# Patient Record
Sex: Female | Born: 1965 | Race: White | Hispanic: No | Marital: Married | Smoking: Former smoker
Health system: Southern US, Community
[De-identification: ages and names within clinical notes are randomized; demographics above are authoritative.]

## PROBLEM LIST (undated history)

## (undated) DIAGNOSIS — K219 Gastro-esophageal reflux disease without esophagitis: Secondary | ICD-10-CM

## (undated) DIAGNOSIS — K529 Noninfective gastroenteritis and colitis, unspecified: Secondary | ICD-10-CM

## (undated) DIAGNOSIS — J302 Other seasonal allergic rhinitis: Secondary | ICD-10-CM

## (undated) DIAGNOSIS — E782 Mixed hyperlipidemia: Secondary | ICD-10-CM

## (undated) HISTORY — PX: ABDOMINAL HYSTERECTOMY: SHX81

---

## 2013-06-06 ENCOUNTER — Emergency Department (HOSPITAL_COMMUNITY): Payer: BC Managed Care – PPO

## 2013-06-06 ENCOUNTER — Encounter (HOSPITAL_COMMUNITY): Payer: Self-pay | Admitting: Emergency Medicine

## 2013-06-06 ENCOUNTER — Observation Stay (HOSPITAL_COMMUNITY): Payer: BC Managed Care – PPO

## 2013-06-06 ENCOUNTER — Observation Stay (HOSPITAL_COMMUNITY)
Admission: EM | Admit: 2013-06-06 | Discharge: 2013-06-07 | Disposition: A | Discharge status: Home / Self Care | Payer: BC Managed Care – PPO | Attending: Internal Medicine | Admitting: Internal Medicine

## 2013-06-06 DIAGNOSIS — Z720 Tobacco use: Secondary | ICD-10-CM

## 2013-06-06 DIAGNOSIS — E785 Hyperlipidemia, unspecified: Secondary | ICD-10-CM | POA: Insufficient documentation

## 2013-06-06 DIAGNOSIS — F17200 Nicotine dependence, unspecified, uncomplicated: Secondary | ICD-10-CM | POA: Insufficient documentation

## 2013-06-06 DIAGNOSIS — J329 Chronic sinusitis, unspecified: Secondary | ICD-10-CM

## 2013-06-06 DIAGNOSIS — Z8249 Family history of ischemic heart disease and other diseases of the circulatory system: Secondary | ICD-10-CM | POA: Insufficient documentation

## 2013-06-06 DIAGNOSIS — E7800 Pure hypercholesterolemia, unspecified: Secondary | ICD-10-CM | POA: Insufficient documentation

## 2013-06-06 DIAGNOSIS — R55 Syncope and collapse: Secondary | ICD-10-CM

## 2013-06-06 DIAGNOSIS — J069 Acute upper respiratory infection, unspecified: Secondary | ICD-10-CM | POA: Insufficient documentation

## 2013-06-06 DIAGNOSIS — E876 Hypokalemia: Secondary | ICD-10-CM | POA: Insufficient documentation

## 2013-06-06 DIAGNOSIS — R0602 Shortness of breath: Secondary | ICD-10-CM | POA: Insufficient documentation

## 2013-06-06 DIAGNOSIS — R209 Unspecified disturbances of skin sensation: Secondary | ICD-10-CM | POA: Insufficient documentation

## 2013-06-06 DIAGNOSIS — J019 Acute sinusitis, unspecified: Secondary | ICD-10-CM | POA: Insufficient documentation

## 2013-06-06 DIAGNOSIS — R079 Chest pain, unspecified: Principal | ICD-10-CM | POA: Insufficient documentation

## 2013-06-06 HISTORY — DX: Mixed hyperlipidemia: E78.2

## 2013-06-06 LAB — CBC WITH DIFFERENTIAL/PLATELET
BASOS ABS: 0 10*3/uL (ref 0.0–0.1)
Basophils Relative: 1 % (ref 0–1)
Eosinophils Absolute: 0.1 10*3/uL (ref 0.0–0.7)
Eosinophils Relative: 2 % (ref 0–5)
HEMATOCRIT: 37.7 % (ref 36.0–46.0)
HEMOGLOBIN: 13.3 g/dL (ref 12.0–15.0)
LYMPHS ABS: 1 10*3/uL (ref 0.7–4.0)
LYMPHS PCT: 14 % (ref 12–46)
MCH: 33.5 pg (ref 26.0–34.0)
MCHC: 35.3 g/dL (ref 30.0–36.0)
MCV: 95 fL (ref 78.0–100.0)
MONO ABS: 0.4 10*3/uL (ref 0.1–1.0)
Monocytes Relative: 5 % (ref 3–12)
NEUTROS ABS: 5.6 10*3/uL (ref 1.7–7.7)
Neutrophils Relative %: 78 % — ABNORMAL HIGH (ref 43–77)
Platelets: 193 10*3/uL (ref 150–400)
RBC: 3.97 MIL/uL (ref 3.87–5.11)
RDW: 13.6 % (ref 11.5–15.5)
WBC: 7.1 10*3/uL (ref 4.0–10.5)

## 2013-06-06 LAB — URINALYSIS, ROUTINE W REFLEX MICROSCOPIC
Bilirubin Urine: NEGATIVE
Glucose, UA: NEGATIVE mg/dL
Ketones, ur: NEGATIVE mg/dL
Leukocytes, UA: NEGATIVE
NITRITE: NEGATIVE
Protein, ur: NEGATIVE mg/dL
Urobilinogen, UA: 0.2 mg/dL (ref 0.0–1.0)
pH: 6 (ref 5.0–8.0)

## 2013-06-06 LAB — BASIC METABOLIC PANEL
BUN: 8 mg/dL (ref 6–23)
CHLORIDE: 101 meq/L (ref 96–112)
CO2: 27 meq/L (ref 19–32)
CREATININE: 0.69 mg/dL (ref 0.50–1.10)
Calcium: 9.2 mg/dL (ref 8.4–10.5)
GFR calc Af Amer: >90 mL/min (ref >90)
GFR calc non Af Amer: >90 mL/min (ref >90)
Glucose, Bld: 101 mg/dL — ABNORMAL HIGH (ref 70–99)
Potassium: 3.6 mEq/L — ABNORMAL LOW (ref 3.7–5.3)
SODIUM: 141 meq/L (ref 137–147)

## 2013-06-06 LAB — TROPONIN I: Troponin I: <0.3 ng/mL (ref <0.30)

## 2013-06-06 LAB — URINE MICROSCOPIC-ADD ON

## 2013-06-06 MED ORDER — HYDROCODONE-ACETAMINOPHEN 5-325 MG PO TABS
1.0000 | ORAL_TABLET | ORAL | Status: DC | PRN
  Filled 2013-06-06: qty 2

## 2013-06-06 MED ORDER — NITROGLYCERIN 0.4 MG SL SUBL: 0.4000 mg | SUBLINGUAL_TABLET | SUBLINGUAL | Status: DC | PRN

## 2013-06-06 MED ORDER — ACETAMINOPHEN 325 MG PO TABS
650.0000 mg | ORAL_TABLET | Freq: Four times a day (QID) | ORAL | Status: DC | PRN
  Administered 2013-06-07: 650 mg via ORAL
  Filled 2013-06-06: qty 2

## 2013-06-06 MED ORDER — SODIUM CHLORIDE 0.9 % IJ SOLN: 3.0000 mL | INTRAMUSCULAR | Status: DC | PRN

## 2013-06-06 MED ORDER — SODIUM CHLORIDE 0.9 % IV BOLUS (SEPSIS)
1000.0000 mL | Freq: Once | INTRAVENOUS | Status: AC
  Administered 2013-06-06: 1000 mL via INTRAVENOUS

## 2013-06-06 MED ORDER — GUAIFENESIN-DM 100-10 MG/5ML PO SYRP
5.0000 mL | ORAL_SOLUTION | ORAL | Status: DC | PRN
  Administered 2013-06-06: 5 mL via ORAL
  Filled 2013-06-06: qty 5

## 2013-06-06 MED ORDER — HEPARIN SODIUM (PORCINE) 5000 UNIT/ML IJ SOLN
5000.0000 [IU] | Freq: Three times a day (TID) | INTRAMUSCULAR | Status: DC
  Administered 2013-06-06 – 2013-06-07 (×3): 5000 [IU] via SUBCUTANEOUS
  Filled 2013-06-06 (×3): qty 1

## 2013-06-06 MED ORDER — ACETAMINOPHEN 650 MG RE SUPP: 650.0000 mg | Freq: Four times a day (QID) | RECTAL | Status: DC | PRN

## 2013-06-06 MED ORDER — ALBUTEROL SULFATE (2.5 MG/3ML) 0.083% IN NEBU
2.5000 mg | INHALATION_SOLUTION | Freq: Two times a day (BID) | RESPIRATORY_TRACT | Status: DC
  Administered 2013-06-07: 2.5 mg via RESPIRATORY_TRACT
  Filled 2013-06-06: qty 3

## 2013-06-06 MED ORDER — SODIUM CHLORIDE 0.9 % IJ SOLN
3.0000 mL | Freq: Two times a day (BID) | INTRAMUSCULAR | Status: DC
  Administered 2013-06-06 – 2013-06-07 (×2): 3 mL via INTRAVENOUS

## 2013-06-06 MED ORDER — ALUM & MAG HYDROXIDE-SIMETH 200-200-20 MG/5ML PO SUSP: 30.0000 mL | Freq: Four times a day (QID) | ORAL | Status: DC | PRN

## 2013-06-06 MED ORDER — ALBUTEROL SULFATE (2.5 MG/3ML) 0.083% IN NEBU
2.5000 mg | INHALATION_SOLUTION | Freq: Three times a day (TID) | RESPIRATORY_TRACT | Status: DC
  Administered 2013-06-06: 2.5 mg via RESPIRATORY_TRACT

## 2013-06-06 MED ORDER — MORPHINE SULFATE 2 MG/ML IJ SOLN: 1.0000 mg | INTRAMUSCULAR | Status: DC | PRN

## 2013-06-06 MED ORDER — NICOTINE 21 MG/24HR TD PT24
21.0000 mg | MEDICATED_PATCH | Freq: Every day | TRANSDERMAL | Status: DC
  Administered 2013-06-07: 21 mg via TRANSDERMAL
  Filled 2013-06-06: qty 1

## 2013-06-06 MED ORDER — OXYMETAZOLINE HCL 0.05 % NA SOLN
1.0000 | Freq: Two times a day (BID) | NASAL | Status: DC
  Administered 2013-06-06 – 2013-06-07 (×2): 1 via NASAL
  Filled 2013-06-06: qty 15

## 2013-06-06 MED ORDER — AZITHROMYCIN 250 MG PO TABS
500.0000 mg | ORAL_TABLET | Freq: Every day | ORAL | Status: DC
  Administered 2013-06-06 – 2013-06-07 (×2): 500 mg via ORAL
  Filled 2013-06-06 (×2): qty 2

## 2013-06-06 MED ORDER — ALBUTEROL SULFATE (2.5 MG/3ML) 0.083% IN NEBU
2.5000 mg | INHALATION_SOLUTION | RESPIRATORY_TRACT | Status: DC | PRN
  Filled 2013-06-06: qty 3

## 2013-06-06 MED ORDER — ASPIRIN 325 MG PO TABS
325.0000 mg | ORAL_TABLET | Freq: Once | ORAL | Status: AC
  Administered 2013-06-06: 325 mg via ORAL
  Filled 2013-06-06: qty 1

## 2013-06-06 MED ORDER — NICOTINE 21 MG/24HR TD PT24
21.0000 mg | MEDICATED_PATCH | Freq: Once | TRANSDERMAL | Status: AC
  Administered 2013-06-06: 21 mg via TRANSDERMAL
  Filled 2013-06-06: qty 1

## 2013-06-06 MED ORDER — ASPIRIN EC 325 MG PO TBEC
325.0000 mg | DELAYED_RELEASE_TABLET | Freq: Every day | ORAL | Status: DC
  Administered 2013-06-06 – 2013-06-07 (×2): 325 mg via ORAL
  Filled 2013-06-06 (×2): qty 1

## 2013-06-06 MED ORDER — ONDANSETRON HCL 4 MG/2ML IJ SOLN: 4.0000 mg | Freq: Four times a day (QID) | INTRAMUSCULAR | Status: DC | PRN

## 2013-06-06 MED ORDER — FLUTICASONE PROPIONATE 50 MCG/ACT NA SUSP
2.0000 | Freq: Every day | NASAL | Status: DC
  Administered 2013-06-06 – 2013-06-07 (×2): 2 via NASAL
  Filled 2013-06-06: qty 16

## 2013-06-06 MED ORDER — ONDANSETRON HCL 4 MG PO TABS: 4.0000 mg | ORAL_TABLET | Freq: Four times a day (QID) | ORAL | Status: DC | PRN

## 2013-06-06 MED ORDER — ONDANSETRON HCL 4 MG/2ML IJ SOLN
4.0000 mg | Freq: Once | INTRAMUSCULAR | Status: AC
  Administered 2013-06-06: 4 mg via INTRAVENOUS
  Filled 2013-06-06: qty 2

## 2013-06-06 MED ORDER — PANTOPRAZOLE SODIUM 40 MG PO TBEC
40.0000 mg | DELAYED_RELEASE_TABLET | Freq: Every day | ORAL | Status: DC
  Administered 2013-06-06 – 2013-06-07 (×2): 40 mg via ORAL
  Filled 2013-06-06 (×2): qty 1

## 2013-06-06 MED ORDER — LORATADINE 10 MG PO TABS
10.0000 mg | ORAL_TABLET | Freq: Every day | ORAL | Status: DC
  Administered 2013-06-06 – 2013-06-07 (×2): 10 mg via ORAL
  Filled 2013-06-06 (×3): qty 1

## 2013-06-06 MED ORDER — SODIUM CHLORIDE 0.9 % IV SOLN: 250.0000 mL | INTRAVENOUS | Status: DC | PRN

## 2013-06-06 MED ORDER — SODIUM CHLORIDE 0.9 % IJ SOLN
3.0000 mL | Freq: Two times a day (BID) | INTRAMUSCULAR | Status: DC
  Administered 2013-06-06: 3 mL via INTRAVENOUS

## 2013-06-06 NOTE — ED Provider Notes (Signed)
CSN: [id number]     Arrival date & time 06/06/13  1304 History  This chart was scribed for Christina Christen, MD by Zettie Pho, ED Scribe. This patient was seen in room APA02/APA02 and the patient's care was started at 2:03 PM.    Chief Complaint  Patient presents with  . Chest Pain  . Shortness of Breath   The history is provided by the patient. No language interpreter was used.   HPI Comments: Christina Richardson is a 48 y.o. Female brought in by EMS who presents to the Emergency Department complaining of an episode of pressure-like pain to the substernal region of the chest that radiated to the mid-back with sudden onset earlier today while she was at work. She states that this type of pain is new for her. Patient reports associated shortness of breath, diaphoresis, pallor, nausea, lightheadedness, and paresthesias to the bilateral hands and feet during the episode. She reports that all of these symptoms have been gradually improving. Patient states that she did not eat breakfast before work this morning, which is typical for her. Patient reports some associated congestion with a productive cough with yellow-colored sputum for the past few days. She reports taking Sudafed, OTC medications, and allergy medications at home without significant relief. She reports a familial history of cardiac problems (mother in her 42s, but not father or siblings). Patient is a current every day smoker, about 0.5 PPD. Patient has no other pertinent medical history.   History reviewed. No pertinent past medical history. Past Surgical History  Procedure Laterality Date  . Abdominal hysterectomy     History reviewed. No pertinent family history. History  Substance Use Topics  . Smoking status: Current Every Day Smoker  . Smokeless tobacco: Not on file  . Alcohol Use: Not on file   OB History   Grav Para Term Preterm Abortions TAB SAB Ect Mult Living                 Review of Systems  A complete 10 system review of  systems was obtained and all systems are negative except as noted in the HPI and PMH.    Allergies  Review of patient's allergies indicates no known allergies.  Home Medications   Prior to Admission medications   Medication Sig Start Date End Date Taking? Authorizing Provider  Dextromethorphan HBr (ROBITUSSIN COUGHGELS PO) Take 2 capsules by mouth as needed (cough).   Yes Historical Provider, MD  Phenylephrine-DM-GG-APAP (MUCINEX FAST-MAX PO) Take 2 tablets by mouth as needed (cold/cough).   Yes Historical Provider, MD   Triage Vitals: BP 118/77  Pulse 80  Temp(Src) 97.7 F (36.5 C) (Oral)  Resp 18  Ht 5\' 8"  (1.727 m)  Wt 145 lb (65.772 kg)  BMI 22.05 kg/m2  SpO2 100%  Physical Exam  Nursing note and vitals reviewed. Constitutional: She is oriented to person, place, and time. She appears well-developed and well-nourished.  HENT:  Head: Normocephalic and atraumatic.  Lips are dry.   Eyes: Conjunctivae and EOM are normal. Pupils are equal, round, and reactive to light.  Neck: Normal range of motion. Neck supple.  Cardiovascular: Normal rate, regular rhythm and normal heart sounds.   Pulmonary/Chest: Effort normal and breath sounds normal.  Abdominal: Soft. Bowel sounds are normal.  Musculoskeletal: Normal range of motion.  Neurological: She is alert and oriented to person, place, and time.  Skin: Skin is warm and dry.  Psychiatric: She has a normal mood and affect. Her behavior is normal.  ED Course  Procedures (including critical care time)  DIAGNOSTIC STUDIES: Oxygen Saturation is 100% on room air, normal by my interpretation.    COORDINATION OF CARE: 1:23 PM- Ordered EKG.   2:11 PM- Discussed cardiac risk factors. Advised of smoking cessation. Will order chest x-ray, troponin, CBC, BMP. Will order IV fluids, Zofran, and aspirin to manage symptoms. Discussed treatment plan with patient at bedside and patient verbalized agreement.   Results for orders placed during  the hospital encounter of 06/06/13  CBC WITH DIFFERENTIAL      Result Value Ref Range   WBC 7.1  4.0 - 10.5 K/uL   RBC 3.97  3.87 - 5.11 MIL/uL   Hemoglobin 13.3  12.0 - 15.0 g/dL   HCT 37.7  36.0 - 46.0 %   MCV 95.0  78.0 - 100.0 fL   MCH 33.5  26.0 - 34.0 pg   MCHC 35.3  30.0 - 36.0 g/dL   RDW 13.6  11.5 - 15.5 %   Platelets 193  150 - 400 K/uL   Neutrophils Relative % 78 (*) 43 - 77 %   Neutro Abs 5.6  1.7 - 7.7 K/uL   Lymphocytes Relative 14  12 - 46 %   Lymphs Abs 1.0  0.7 - 4.0 K/uL   Monocytes Relative 5  3 - 12 %   Monocytes Absolute 0.4  0.1 - 1.0 K/uL   Eosinophils Relative 2  0 - 5 %   Eosinophils Absolute 0.1  0.0 - 0.7 K/uL   Basophils Relative 1  0 - 1 %   Basophils Absolute 0.0  0.0 - 0.1 K/uL  BASIC METABOLIC PANEL      Result Value Ref Range   Sodium 141  137 - 147 mEq/L   Potassium 3.6 (*) 3.7 - 5.3 mEq/L   Chloride 101  96 - 112 mEq/L   CO2 27  19 - 32 mEq/L   Glucose, Bld 101 (*) 70 - 99 mg/dL   BUN 8  6 - 23 mg/dL   Creatinine, Ser 0.69  0.50 - 1.10 mg/dL   Calcium 9.2  8.4 - 10.5 mg/dL   GFR calc non Af Amer >90  >90 mL/min   GFR calc Af Amer >90  >90 mL/min  TROPONIN I      Result Value Ref Range   Troponin I <0.30  <0.30 ng/mL  URINALYSIS, ROUTINE W REFLEX MICROSCOPIC      Result Value Ref Range   Color, Urine YELLOW  YELLOW   APPearance CLEAR  CLEAR   Specific Gravity, Urine <1.005 (*) 1.005 - 1.030   pH 6.0  5.0 - 8.0   Glucose, UA NEGATIVE  NEGATIVE mg/dL   Hgb urine dipstick SMALL (*) NEGATIVE   Bilirubin Urine NEGATIVE  NEGATIVE   Ketones, ur NEGATIVE  NEGATIVE mg/dL   Protein, ur NEGATIVE  NEGATIVE mg/dL   Urobilinogen, UA 0.2  0.0 - 1.0 mg/dL   Nitrite NEGATIVE  NEGATIVE   Leukocytes, UA NEGATIVE  NEGATIVE  URINE MICROSCOPIC-ADD ON      Result Value Ref Range   Squamous Epithelial / LPF FEW (*) RARE   WBC, UA 0-2  <3 WBC/hpf   RBC / HPF 0-2  <3 RBC/hpf   Bacteria, UA RARE  RARE   Dg Chest 2 View  06/06/2013   CLINICAL DATA:   Chest pain, shortness of breath  EXAM: CHEST  2 VIEW  COMPARISON:  None.  FINDINGS: Cardiomediastinal silhouette is unremarkable. No acute infiltrate  or pleural effusion. No pulmonary edema. Bony thorax is unremarkable.  IMPRESSION: No active cardiopulmonary disease.   Electronically Signed   By: Lahoma Crocker M.D.   On: 06/06/2013 14:57    EKG Interpretation   Date/Time:  Thursday Jun 06 2013 13:23:59 EDT Ventricular Rate:  85 PR Interval:  168 QRS Duration: 99 QT Interval:  414 QTC Calculation: 492 R Axis:   54 Text Interpretation:  Sinus rhythm RSR' in V1 or V2, right VCD or RVH  Borderline prolonged QT interval Confirmed by Lacinda Axon  MD, Jenevie Casstevens ([id number]) on  06/06/2013 2:30:50 PM Also confirmed by Lacinda Axon  MD, Coalton Arch ([id number])  on  06/06/2013 3:22:24 PM      MDM   Final diagnoses:  Chest pain    Significant chest pressure with associated diaphoresis and sense of presyncope at work today.  Patient is a smoker and her mother had coronary artery disease in her 101s.   She feels better. Admit to observation.  Discussed with cardiologist and hospitalist.  I personally performed the services described in this documentation, which was scribed in my presence. The recorded information has been reviewed and is accurate.     Christina Christen, MD 06/06/13 [phone]

## 2013-06-06 NOTE — ED Notes (Signed)
Pt currently denies chest pain. nad.

## 2013-06-06 NOTE — ED Notes (Signed)
Pt waiting for inpt bed. No bed available at present. Pt and family aware
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
## 2013-06-06 NOTE — ED Notes (Signed)
Pt comes from work via EMS with c/o centralized chest pain and SOB. Pt states symptoms began shortly after arriving to work. Pt describes pain as pressure. Pt also reports nausea, "tingling" in face and arms, lightheadedness. Pt states for past week she's had productive cough with yellow sputum.

## 2013-06-07 ENCOUNTER — Observation Stay (HOSPITAL_COMMUNITY): Payer: BC Managed Care – PPO

## 2013-06-07 ENCOUNTER — Encounter (HOSPITAL_COMMUNITY): Payer: Self-pay | Admitting: Adult Health

## 2013-06-07 ENCOUNTER — Encounter (HOSPITAL_COMMUNITY): Payer: Self-pay

## 2013-06-07 DIAGNOSIS — I472 Ventricular tachycardia: Secondary | ICD-10-CM

## 2013-06-07 DIAGNOSIS — R55 Syncope and collapse: Secondary | ICD-10-CM

## 2013-06-07 DIAGNOSIS — F17200 Nicotine dependence, unspecified, uncomplicated: Secondary | ICD-10-CM

## 2013-06-07 DIAGNOSIS — R079 Chest pain, unspecified: Secondary | ICD-10-CM

## 2013-06-07 DIAGNOSIS — I4729 Other ventricular tachycardia: Secondary | ICD-10-CM

## 2013-06-07 DIAGNOSIS — J329 Chronic sinusitis, unspecified: Secondary | ICD-10-CM

## 2013-06-07 LAB — BASIC METABOLIC PANEL
BUN: 9 mg/dL (ref 6–23)
CO2: 26 meq/L (ref 19–32)
Calcium: 8.5 mg/dL (ref 8.4–10.5)
Chloride: 105 mEq/L (ref 96–112)
Creatinine, Ser: 0.7 mg/dL (ref 0.50–1.10)
GFR calc Af Amer: >90 mL/min (ref >90)
GFR calc non Af Amer: >90 mL/min (ref >90)
GLUCOSE: 102 mg/dL — AB (ref 70–99)
Potassium: 3.6 mEq/L — ABNORMAL LOW (ref 3.7–5.3)
SODIUM: 142 meq/L (ref 137–147)

## 2013-06-07 LAB — CBC
HCT: 33.9 % — ABNORMAL LOW (ref 36.0–46.0)
HEMOGLOBIN: 12 g/dL (ref 12.0–15.0)
MCH: 34.2 pg — AB (ref 26.0–34.0)
MCHC: 35.4 g/dL (ref 30.0–36.0)
MCV: 96.6 fL (ref 78.0–100.0)
Platelets: 157 10*3/uL (ref 150–400)
RBC: 3.51 MIL/uL — ABNORMAL LOW (ref 3.87–5.11)
RDW: 13.9 % (ref 11.5–15.5)
WBC: 3.8 10*3/uL — ABNORMAL LOW (ref 4.0–10.5)

## 2013-06-07 LAB — LIPID PANEL
CHOLESTEROL: 191 mg/dL (ref 0–200)
HDL: 29 mg/dL — AB (ref >39)
LDL Cholesterol: 134 mg/dL — ABNORMAL HIGH (ref 0–99)
TRIGLYCERIDES: 140 mg/dL (ref <150)
Total CHOL/HDL Ratio: 6.6 RATIO
VLDL: 28 mg/dL (ref 0–40)

## 2013-06-07 LAB — TROPONIN I
Troponin I: <0.3 ng/mL (ref <0.30)
Troponin I: <0.3 ng/mL (ref <0.30)

## 2013-06-07 LAB — HEMOGLOBIN A1C
HEMOGLOBIN A1C: 5.4 % (ref <5.7)
MEAN PLASMA GLUCOSE: 108 mg/dL (ref <117)

## 2013-06-07 MED ORDER — OMEPRAZOLE 40 MG PO CPDR: 40.0000 mg | DELAYED_RELEASE_CAPSULE | Freq: Every day | ORAL | Status: AC

## 2013-06-07 MED ORDER — TECHNETIUM TC 99M SESTAMIBI - CARDIOLITE
30.0000 | Freq: Once | INTRAVENOUS | Status: AC | PRN
  Administered 2013-06-07: 12:00:00 30 via INTRAVENOUS

## 2013-06-07 MED ORDER — FLUTICASONE PROPIONATE 50 MCG/ACT NA SUSP: 2.0000 | Freq: Every day | NASAL | Status: DC

## 2013-06-07 MED ORDER — AZITHROMYCIN 250 MG PO TABS: 250.0000 mg | ORAL_TABLET | Freq: Every day | ORAL | Status: DC

## 2013-06-07 MED ORDER — SODIUM CHLORIDE 0.9 % IJ SOLN
INTRAMUSCULAR | Status: AC
  Administered 2013-06-07: 10 mL via INTRAVENOUS
  Filled 2013-06-07: qty 10

## 2013-06-07 MED ORDER — LORATADINE 10 MG PO TABS: 10.0000 mg | ORAL_TABLET | Freq: Every day | ORAL | Status: DC

## 2013-06-07 MED ORDER — POTASSIUM CHLORIDE CRYS ER 20 MEQ PO TBCR
20.0000 meq | EXTENDED_RELEASE_TABLET | Freq: Once | ORAL | Status: AC
  Administered 2013-06-07: 20 meq via ORAL
  Filled 2013-06-07: qty 1

## 2013-06-07 MED ORDER — TECHNETIUM TC 99M SESTAMIBI GENERIC - CARDIOLITE
10.0000 | Freq: Once | INTRAVENOUS | Status: AC | PRN
  Administered 2013-06-07: 10 via INTRAVENOUS

## 2013-06-07 MED ORDER — HYDROCODONE-ACETAMINOPHEN 5-325 MG PO TABS: 1.0000 | ORAL_TABLET | ORAL | Status: DC | PRN

## 2013-06-07 NOTE — Progress Notes (Signed)
UR completed 

## 2013-06-07 NOTE — Progress Notes (Signed)
Ms.Shearer discharged this evening,with instructions given on medications,and follow visits,patient verbalized understanding.Ambulating in room without difficulty,no c/o pain or discomfort noted.Accompanied by staff to an awaiting vehicle.

## 2013-06-07 NOTE — Progress Notes (Signed)
  Echocardiogram 2D Echocardiogram has been performed.  Valinda Hoar 06/07/2013, 10:31 AM

## 2013-06-07 NOTE — Discharge Summary (Signed)
Physician Discharge Summary  Christina Richardson ZOX:[id number] DOB: 1965-01-19 DOA: 06/06/2013  PCP: Celedonio Savage, MD  Admit date: 06/06/2013 Discharge date: 06/07/2013  Time spent: <30 minutes  Recommendations for Outpatient Follow-up:  Follow-up Information   Follow up with Celedonio Savage, MD. (In one to 2 weeks, call for appointment upon discharge)    Specialty:  Family Medicine   Contact information:   [place] [address] [phone]        Discharge Diagnoses:  Active Problems:   Chest pain   Vasovagal near syncope  URI/acute sinusitis  Discharge Condition: Improved/stable  Diet recommendation: Regular  Filed Weights   06/06/13 1308 06/06/13 2106  Weight: 65.772 kg (145 lb) 61.598 kg (135 lb 12.8 oz)    History of present illness:  Christina Richardson is a 48 y.o. female with a Past Medical History of back abuse who presents today with the above noted complaint. The patient, for the past one week she has had nasal congestion and post nasal drip symptoms along with cough,she has tried numerous over-the-counter antihistamines, nasal sprays without any relief. This morning, while at work (works as a Occupational psychologist at [employer]), she had an episode of flushing and diaphoresis, she claims that she had tingling and numbness in her bilateral hands, some pain in the jaw area. She immediately sat down, then proceeded to walk to the breakroom when she experienced a presyncopal episode, this was then associated with significant retrosternal discomfort and shortness of breath. EMS was called, patient was then brought to the emergency room where symptoms completely resolved with supportive care. I was then asked to admit this patient for further evaluation and treatment. During my evaluation, patient was completely asymptomatic, she had infact ambulated in the ED without any discomfort.  There is no history of fever, headache, nausea, vomiting, abdominal pain, dysuria.       Hospital Course:   . Chest pain  - With some typical and atypical features, risk factors being smoking and family history.  -As discussed above, patient was admitted to telemetry unit. Cardiac enzymes were cycled and troponins are negative. She was kept n.p.o. cardiology consulted and on followup today a stress test was done and low risk-per cards no further workup indicated -She was placed on a PPI probable GERD and also treated for probable acute sinusitis -She has been chest pain-free -She'll be discharged at this time and is to follow up outpatient with her PCP . Presyncope  - Monitor in telemetry, as above cardiac enzymes were cycled and came back negative she was monitored on telemetry and no significant arrhythmias reported. 2-D echo was done with EF of 55-60% and no major valvular abnormalities reported -Likely vasovagal, as clinically improved and asymptomatic at this time>> we'll discharge for outpatient followup. . Tobacco abuse  - Counseled extensively to quit tobacco . Post Nasal Drip Symptoms-? Acute sinusitis  -Patient reported URI symptoms including postnasal drip and XRay Sinuses showed fluid level in right maxillary sinus commonly associated with acute sinusitis- she was treated with oxymetolazone nasal spray short course as well as Flonase and antihistamine an empiric Zithromax. She is clinically improved she be discharged Flonase, Zithromax, and the histamine and is to followup with her PCP outpatient.  Procedures:  2-D echo Impressions:  - Normal LV wall thickness and chamber size with LVEF 08-65%, normal diastolic function. No major valvular abnormalities. No pericardial effusion.     Nuclear Stress test IMPRESSION:  Low risk exercise Cardiolite as  outlined. There were no diagnostic  ST segment abnormalities. Low risk Duke treadmill score of 9.  Perfusion imaging shows no evidence of scar or ischemia with normal  LVEF of 76%.    Consultations:  Cardiology-Dr. Domenic Polite  Discharge Exam: Filed Vitals:   06/07/13 1354  BP: 97/61  Pulse: 95  Temp: 97.8 F (36.6 C)  Resp: 20   Exam:  General: alert & oriented x 3 In NAD Cardiovascular: RRR, nl S1 s2 Respiratory: CTAB Abdomen: soft +BS NT/ND, no masses palpable Extremities: No cyanosis and no edema      Discharge Instructions You were cared for by a hospitalist during your hospital stay. If you have any questions about your discharge medications or the care you received while you were in the hospital after you are discharged, you can call the unit and asked to speak with the hospitalist on call if the hospitalist that took care of you is not available. Once you are discharged, your primary care physician will handle any further medical issues. Please note that NO REFILLS for any discharge medications will be authorized once you are discharged, as it is imperative that you return to your primary care physician (or establish a relationship with a primary care physician if you do not have one) for your aftercare needs so that they can reassess your need for medications and monitor your lab values.  Discharge Instructions   Diet - low sodium heart healthy    Complete by:  As directed      Diet general    Complete by:  As directed      Increase activity slowly    Complete by:  As directed             Medication List         azithromycin 250 MG tablet  Commonly known as:  ZITHROMAX  Take 1 tablet (250 mg total) by mouth daily. For 4 days     fluticasone 50 MCG/ACT nasal spray  Commonly known as:  FLONASE  Place 2 sprays into both nostrils daily.     HYDROcodone-acetaminophen 5-325 MG per tablet  Commonly known as:  NORCO/VICODIN  Take 1-2 tablets by mouth every 4 (four) hours as needed for moderate pain.     loratadine 10 MG tablet  Commonly known as:  CLARITIN  Take 1 tablet (10 mg total) by mouth daily.     MUCINEX FAST-MAX PO  Take 2  tablets by mouth as needed (cold/cough).     omeprazole 40 MG capsule  Commonly known as:  PRILOSEC  Take 1 capsule (40 mg total) by mouth daily.     ROBITUSSIN COUGHGELS PO  Take 2 capsules by mouth as needed (cough).       No Known Allergies     Follow-up Information   Follow up with Celedonio Savage, MD. (In one to 2 weeks, call for appointment upon discharge)    Specialty:  Family Medicine   Contact information:   [address] [place] [address] [phone]        The results of significant diagnostics from this hospitalization (including imaging, microbiology, ancillary and laboratory) are listed below for reference.    Significant Diagnostic Studies: Dg Sinuses Complete  06/07/2013   CLINICAL DATA:  Maxillary sinusitis. Pain and pressure in the maxillary area.  EXAM: PARANASAL SINUSES - COMPLETE 3 + VIEW  COMPARISON:  None.  FINDINGS: There is partial opacification of the right maxillary sinus suggesting either mucosal thickening  are of fluid level. On the lateral view, this appears to the represent fluid with the meniscus. Frontal sinuses appear normally aerated. Mastoid air cells grossly appear normal. Ethmoid air cells appear normal.  IMPRESSION: Fluid level in the right maxillary sinus commonly associated with acute sinusitis.   Electronically Signed   By: Dereck Ligas M.D.   On: 06/07/2013 08:41   Dg Chest 2 View  06/06/2013   CLINICAL DATA:  Chest pain, shortness of breath  EXAM: CHEST  2 VIEW  COMPARISON:  None.  FINDINGS: Cardiomediastinal silhouette is unremarkable. No acute infiltrate or pleural effusion. No pulmonary edema. Bony thorax is unremarkable.  IMPRESSION: No active cardiopulmonary disease.   Electronically Signed   By: Lahoma Crocker M.D.   On: 06/06/2013 14:57   Nm Myocar Single W/spect W/wall Motion And Ef  06/07/2013   CLINICAL DATA:  48 year old woman with history of tobacco abuse and hyperlipidemia, presenting with atypical chest pain. This study is  requested to evaluate for the presence of ischemia.  EXAM: MYOCARDIAL IMAGING WITH SPECT (REST AND EXERCISE)  GATED LEFT VENTRICULAR WALL MOTION STUDY  LEFT VENTRICULAR EJECTION FRACTION  TECHNIQUE: Standard myocardial SPECT imaging was performed after resting intravenous injection of 10 mCi Tc-72m sestamibi. Subsequently, exercise tolerance test was performed by the patient under the supervision of the Cardiology staff. At peak-stress, 30 mCi Tc-40m sestamibi was injected intravenously and standard myocardial SPECT imaging was performed. Quantitative gated imaging was also performed to evaluate left ventricular wall motion, and estimate left ventricular ejection fraction.  FINDINGS: Baseline tracing shows normal sinus rhythm with decreased R wave progression. Patient was exercised on a Bruce protocol for 9 min achieving a maximum work load of 10.1 METS. Heart rate increased from 75 beats per min up to 160 beats per min which was 91% of the maximum age predicted heart rate response. Blood pressure increased from 95/64 up to 143/62. No chest pain was reported. There were no diagnostic ST segment abnormalities, rare PAC noted.  Analysis of the overall perfusion data finds mild breast attenuation.  Tomographic views were obtained using the short axis, vertical long axis, and horizontal long axis planes. Myocardial perfusion is normal at stress, no reversible defects to indicate ischemia.  Gated imaging reveals an EDV of 58, ESV of 14, LVEF of 76%, and TID ratio of 0.80.  IMPRESSION: Low risk exercise Cardiolite as outlined. There were no diagnostic ST segment abnormalities. Low risk Duke treadmill score of 9. Perfusion imaging shows no evidence of scar or ischemia with normal LVEF of 76%.   Electronically Signed   By: Rozann Lesches M.D.   On: 06/07/2013 15:53    Microbiology: No results found for this or any previous visit (from the past 240 hour(s)).   Labs: Basic Metabolic Panel:  Recent Labs Lab  06/06/13 1432 06/07/13 0212  NA 141 142  K 3.6* 3.6*  CL 101 105  CO2 27 26  GLUCOSE 101* 102*  BUN 8 9  CREATININE 0.69 0.70  CALCIUM 9.2 8.5   Liver Function Tests: No results found for this basename: AST, ALT, ALKPHOS, BILITOT, PROT, ALBUMIN,  in the last 168 hours No results found for this basename: LIPASE, AMYLASE,  in the last 168 hours No results found for this basename: AMMONIA,  in the last 168 hours CBC:  Recent Labs Lab 06/06/13 1432 06/07/13 0212  WBC 7.1 3.8*  NEUTROABS 5.6  --   HGB 13.3 12.0  HCT 37.7 33.9*  MCV 95.0 96.6  PLT 193 157   Cardiac Enzymes:  Recent Labs Lab 06/06/13 1432 06/06/13 2117 06/07/13 0212 06/07/13 0830  TROPONINI <0.30 <0.30 <0.30 <0.30   BNP: BNP (last 3 results) No results found for this basename: PROBNP,  in the last 8760 hours CBG: No results found for this basename: GLUCAP,  in the last 168 hours     Signed:  [facility] 06/07/2013, 5:10 PM

## 2013-06-07 NOTE — Consult Note (Signed)
CARDIOLOGY CONSULT NOTE   Patient ID: Christina Richardson MRN: [id number] DOB/AGE: [birth year] 46 y.o.  Admit Date: 06/06/2013 Referring Physician: PTH Primary Physician: Celedonio Savage, MD Consulting Cardiologist: Rozann Lesches MD Reason for Consultation: Chest Pain  Clinical Summary Christina Richardson is a 48 y.o.female with no prior cardiac history, but with family history of coronary artery disease, history of smoking (30 pack year), and hyperlipidemia, presented to the emergency room with chest pressure, which followed onset of presyncope, diaphoresis, abdominal discomfort, and weakness. The patient works as a Occupational psychologist at [employer], was sitting at [employer] and prescriptions when symptoms began. She states that she began to feel very nauseous and clammy, diaphoretic, weak. She was advised to go sit in a breakroom but on her way there she had near syncope was severe nausea. She began to have tingling in her hands and in her neck and jaw, with chest pressure. EMS was called and she was brought to the ER.  On arrival to ER the patient's blood pressure is 118/77 heart rate 80 O2 sat 100% with a temperature 97.7. Labs are drawn with an abnormal potassium of 3.6, cardiac enzymes are found be negative x3. Chest x-ray was negative for acute cardiopulmonary disease. The patient was given IV fluid bolus, aspirin, and Zofran for symptoms of nausea. She was admitted for overnight observation to rule out cardiac etiology for discomfort.  The patient denies palpitations, previous episodes of severe weakness nausea or presyncope. She did state that in the past she has had some transient dizziness nonsustained.   No Known Allergies  Medications Scheduled Medications: . albuterol  2.5 mg Nebulization BID  . aspirin EC  325 mg Oral Daily  . azithromycin  500 mg Oral Daily  . fluticasone  2 spray Each Nare Daily  . heparin  5,000 Units Subcutaneous 3 times per day  . loratadine  10 mg Oral Daily  .  nicotine  21 mg Transdermal Daily  . nicotine  21 mg Transdermal Once  . oxymetazoline  1 spray Each Nare BID  . pantoprazole  40 mg Oral Daily  . potassium chloride  20 mEq Oral Once  . sodium chloride  3 mL Intravenous Q12H  . sodium chloride  3 mL Intravenous Q12H    PRN Medications: sodium chloride, acetaminophen, acetaminophen, albuterol, alum & mag hydroxide-simeth, guaiFENesin-dextromethorphan, HYDROcodone-acetaminophen, morphine injection, nitroGLYCERIN, ondansetron (ZOFRAN) IV, ondansetron, sodium chloride   Past Medical History  Diagnosis Date  . Mixed hyperlipidemia     Past Surgical History  Procedure Laterality Date  . Abdominal hysterectomy      With oopherectomy    Family History  Problem Relation Age of Onset  . Heart attack Mother   . Stroke Mother   . Stroke Maternal Grandmother   . Diabetes Mother   . Diabetes Maternal Grandmother     Social History Christina Richardson reports that she has been smoking Cigarettes.  She has been smoking about 0.00 packs per day for the past 30 years. She does not have any smokeless tobacco history on file. Christina Richardson reports that she does not drink alcohol.  Review of Systems Otherwise reviewed and negative except as outlined.  Physical Examination Blood pressure 120/76, pulse 85, temperature 97.4 F (36.3 C), temperature source Oral, resp. rate 20, height 5\' 8"  (1.727 m), weight 135 lb 12.8 oz (61.598 kg), SpO2 96.00%. No intake or output data in the 24 hours ending 06/07/13 0956  Telemetry: NSR   GEN: No acute distress,  resting comfortably HEENT: Conjunctiva and lids normal, oropharynx clear with moist mucosa. Neck: Supple, no elevated JVP or carotid bruits, no thyromegaly. Lungs: Clear to auscultation, nonlabored breathing at rest. Cardiac: Regular rate and rhythm, no S3 or significant systolic murmur, no pericardial rub. Abdomen: Soft, nontender, no hepatomegaly, bowel sounds present, no guarding or  rebound. Extremities: No pitting edema, distal pulses 2+. Skin: Warm and dry. Musculoskeletal: No kyphosis. Neuropsychiatric: Alert and oriented x3, affect grossly appropriate.  Prior Cardiac Testing/Procedures None  Lab Results  Basic Metabolic Panel:  Recent Labs Lab 06/06/13 1432 06/07/13 0212  NA 141 142  K 3.6* 3.6*  CL 101 105  CO2 27 26  GLUCOSE 101* 102*  BUN 8 9  CREATININE 0.69 0.70  CALCIUM 9.2 8.5   CBC:  Recent Labs Lab 06/06/13 1432 06/07/13 0212  WBC 7.1 3.8*  NEUTROABS 5.6  --   HGB 13.3 12.0  HCT 37.7 33.9*  MCV 95.0 96.6  PLT 193 157    Cardiac Enzymes:  Recent Labs Lab 06/06/13 1432 06/06/13 2117 06/07/13 0212 06/07/13 0830  TROPONINI <0.30 <0.30 <0.30 <0.30     Radiology: Dg Sinuses Complete  06/07/2013   CLINICAL DATA:  Maxillary sinusitis. Pain and pressure in the maxillary area.  EXAM: PARANASAL SINUSES - COMPLETE 3 + VIEW  COMPARISON:  None.  FINDINGS: There is partial opacification of the right maxillary sinus suggesting either mucosal thickening are of fluid level. On the lateral view, this appears to the represent fluid with the meniscus. Frontal sinuses appear normally aerated. Mastoid air cells grossly appear normal. Ethmoid air cells appear normal.  IMPRESSION: Fluid level in the right maxillary sinus commonly associated with acute sinusitis.   Electronically Signed   By: Dereck Ligas M.D.   On: 06/07/2013 08:41   Dg Chest 2 View  06/06/2013   CLINICAL DATA:  Chest pain, shortness of breath  EXAM: CHEST  2 VIEW  COMPARISON:  None.  FINDINGS: Cardiomediastinal silhouette is unremarkable. No acute infiltrate or pleural effusion. No pulmonary edema. Bony thorax is unremarkable.  IMPRESSION: No active cardiopulmonary disease.   Electronically Signed   By: Lahoma Crocker M.D.   On: 06/06/2013 14:57     ECG:NSR with rate of 85 bpm with non-specific T-wave abnormalities.   Impression and Recommendations  1. Atypical chest pain:  Associated with sudden onset of weakness, nausea diaphoresis, near syncope and tingling in her jaw, and hands. She has CVRF of tobacco abuse, FH, hypercholesterolemia.  EKG shows non-specific T-wave abnormalities. Cardiac enzymes argue against ACS. I have discussed with her the stress test and she is willing to proceed with this. Will be completed today.  2. Hypokalemia:  Will replete to keep at least 4.0.  3. Tobacco Abuse: 30 pk year hx. Cessation is discussed. States that she cannot take Chantix or Wellbutrin as this has been used in the past.  4. Hypercholesterolemia: Past history of same. Not on statin. Will check lipid study as she is NPO.    Signed: Phill Myron. Lawrence NP 06/07/2013, 9:56 AM Co-Sign MD  Attending note:  Patient seen and examined. Reviewed records and modified above note by Ms. Lawrence NP. Ms. Ayllon presents after an episode that occurred yesterday at work. She states that she was seated, was not feeling "quite right," specifically somewhat clammy and diaphoretic as well as dizzy. Patient states she was told that she looked "pale" and got up to go to break room. As she was standing and walking, she began to  feel more dizzy as if she might pass out, also like things were "closing in" associated with a tingling in her fingers, tightness in her chest, hyperventilating with some perioral numbness. She sat down and was tended to, ultimately sent to the ER. There was never frank syncope. Description very consistent with that of a vasovagal event - triggering etiology not clear however. She states she has had similar but typically more mild events over the years. She has ruled out for myocardial infarction, has no arrhythmias by telemetry, and ECG shows no significant ST segment changes. She has been scheduled for a stress test for ischemic workup, although obstructive CAD seems to be unlikely as a cause of her presentation. Cardiac risk factors include family history, reported  hyperlipidemia, and long-standing tobacco use. Will followup on the results of testing.  Satira Sark, M.D., F.A.C.C.

## 2013-06-07 NOTE — Progress Notes (Signed)
Stress Lab Nurses Notes - Christina Richardson  Christina Richardson 06/07/2013 Reason for doing test: Chest Pain and Dyspnea Type of test: Stress Cardiolite Nurse performing test: Gerrit Halls, RN Nuclear Medicine Tech: Dyanne Carrel Echo Tech: Not Applicable MD performing test: S. McDowell/K.Lawrence NP Family MD: Bluth Test explained and consent signed: yes IV started: 22g jelco, Saline lock flushed, IV in progress from floor and No redness or edema Symptoms: headache Treatment/Intervention: None Reason test stopped: fatigue After recovery IV was: No redness or edema and Saline Lock flushed Patient to return to [place]. Med at : 13:00 Patient discharged: Transported back to room 306 via wc Patient's Condition upon discharge was: stable Comments: During test peak BP 143/62 & HR 160.  Recovery BP 106/72 & HR 94 .Symptoms resolved in recovery. [name]

## 2013-06-07 NOTE — Progress Notes (Signed)
    Echocardiogram and cardiolite results available in chart. No evidence of ischemia or cardiomyopathy. No further cardiac workup anticipated at this time.  Satira Sark, M.D., F.A.C.C.

## 2015-06-05 IMAGING — CR DG SINUSES COMPLETE 3+V
4 series · 4 of 4 positions shown · non-contrast
Comparison: None.

CLINICAL DATA: Maxillary sinusitis. Pain and pressure in the
maxillary area.

EXAM:
PARANASAL SINUSES - COMPLETE 3 + VIEW

[view not recorded (1 of 4)]
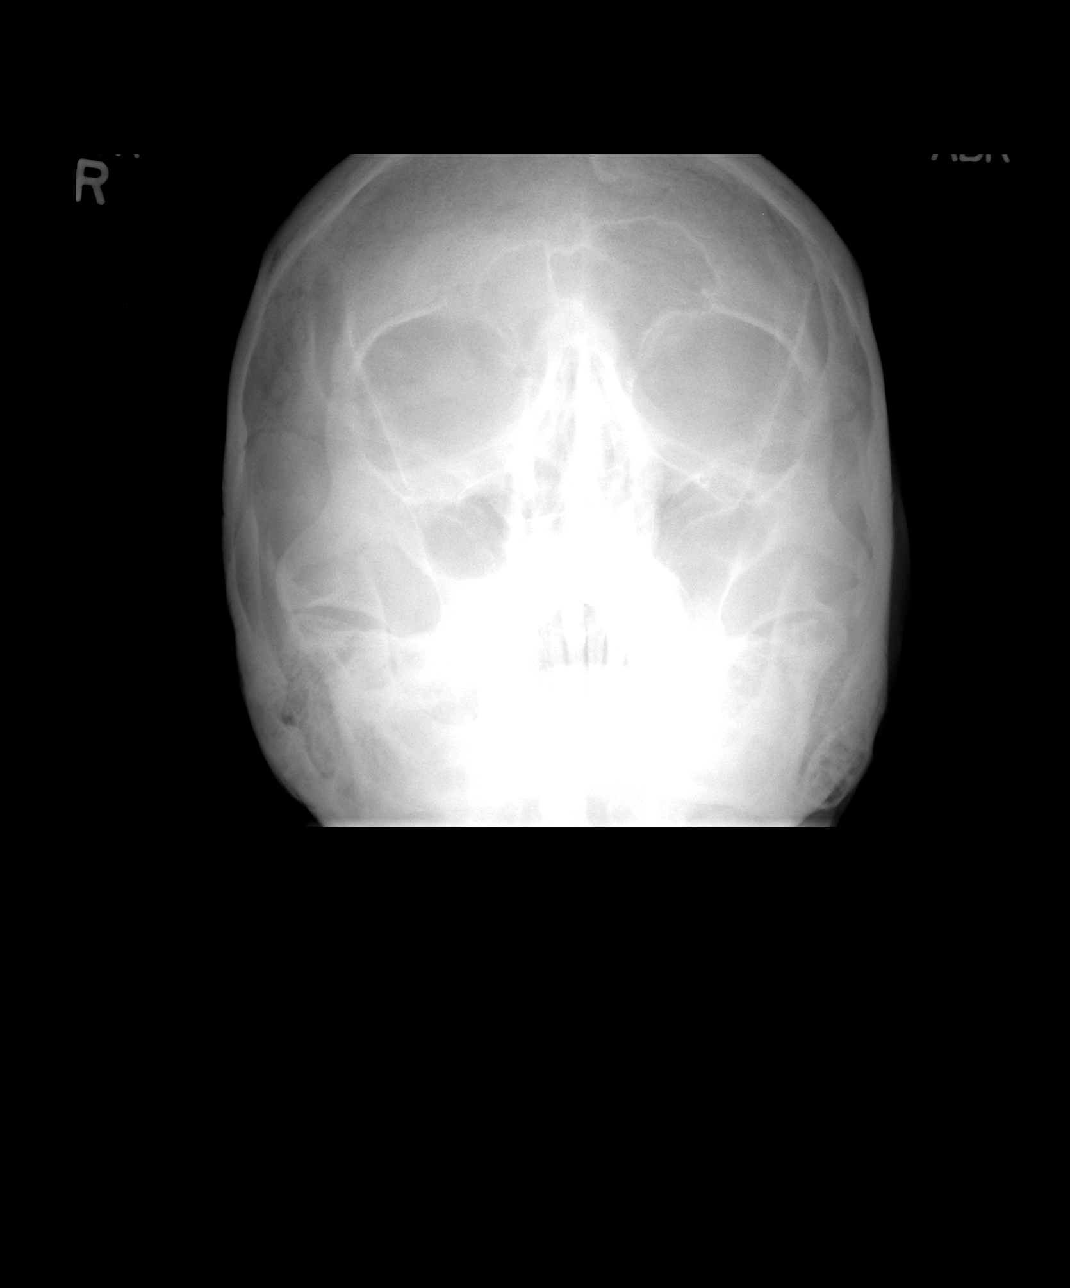

[view not recorded (2 of 4)]
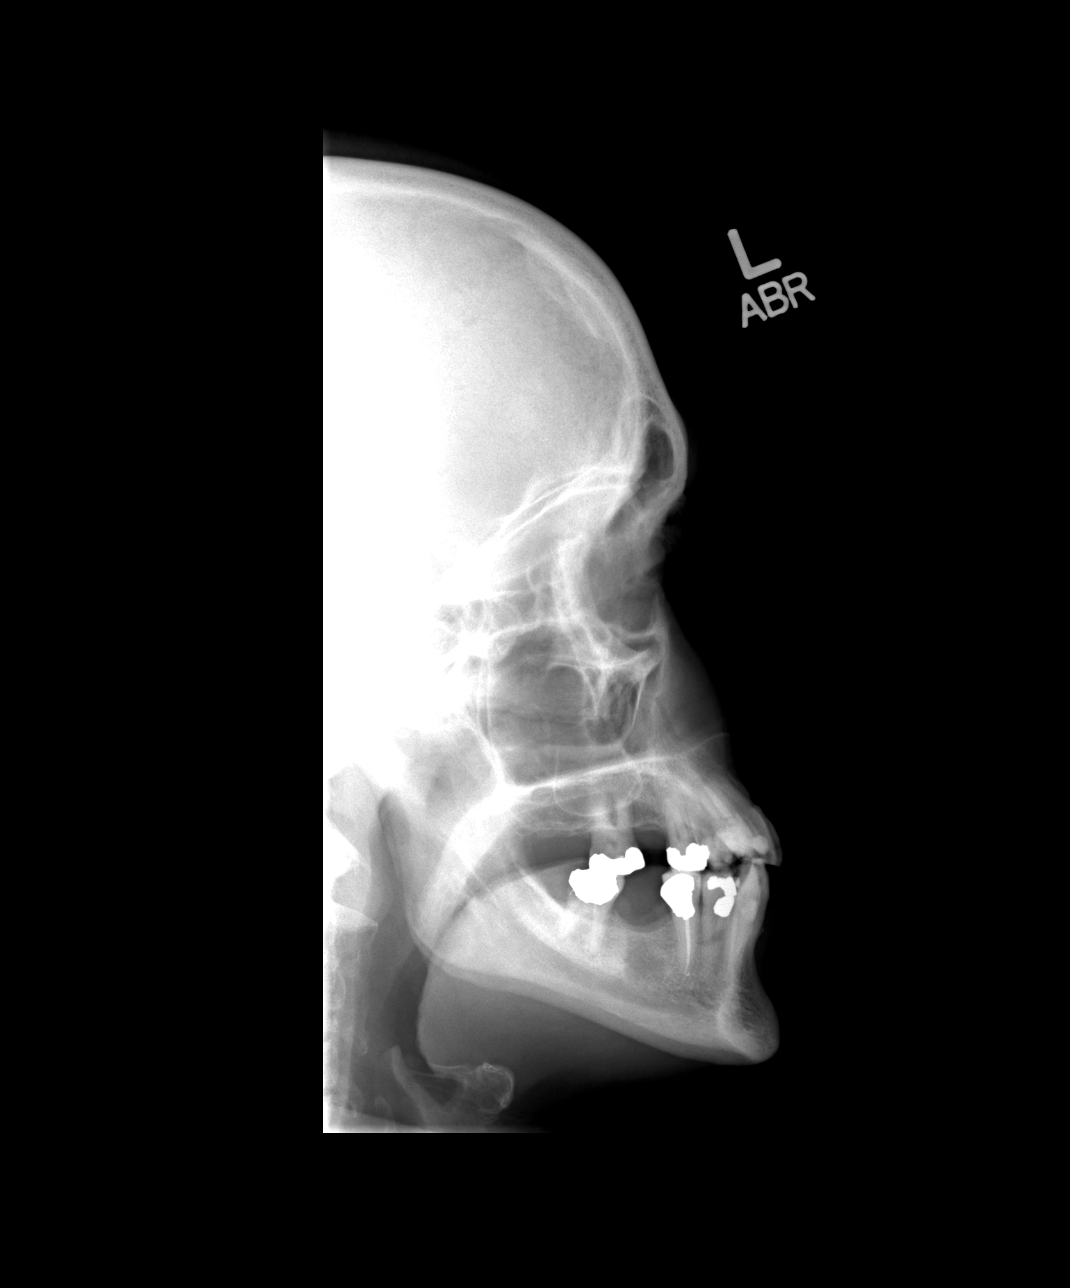

[view not recorded (3 of 4)]
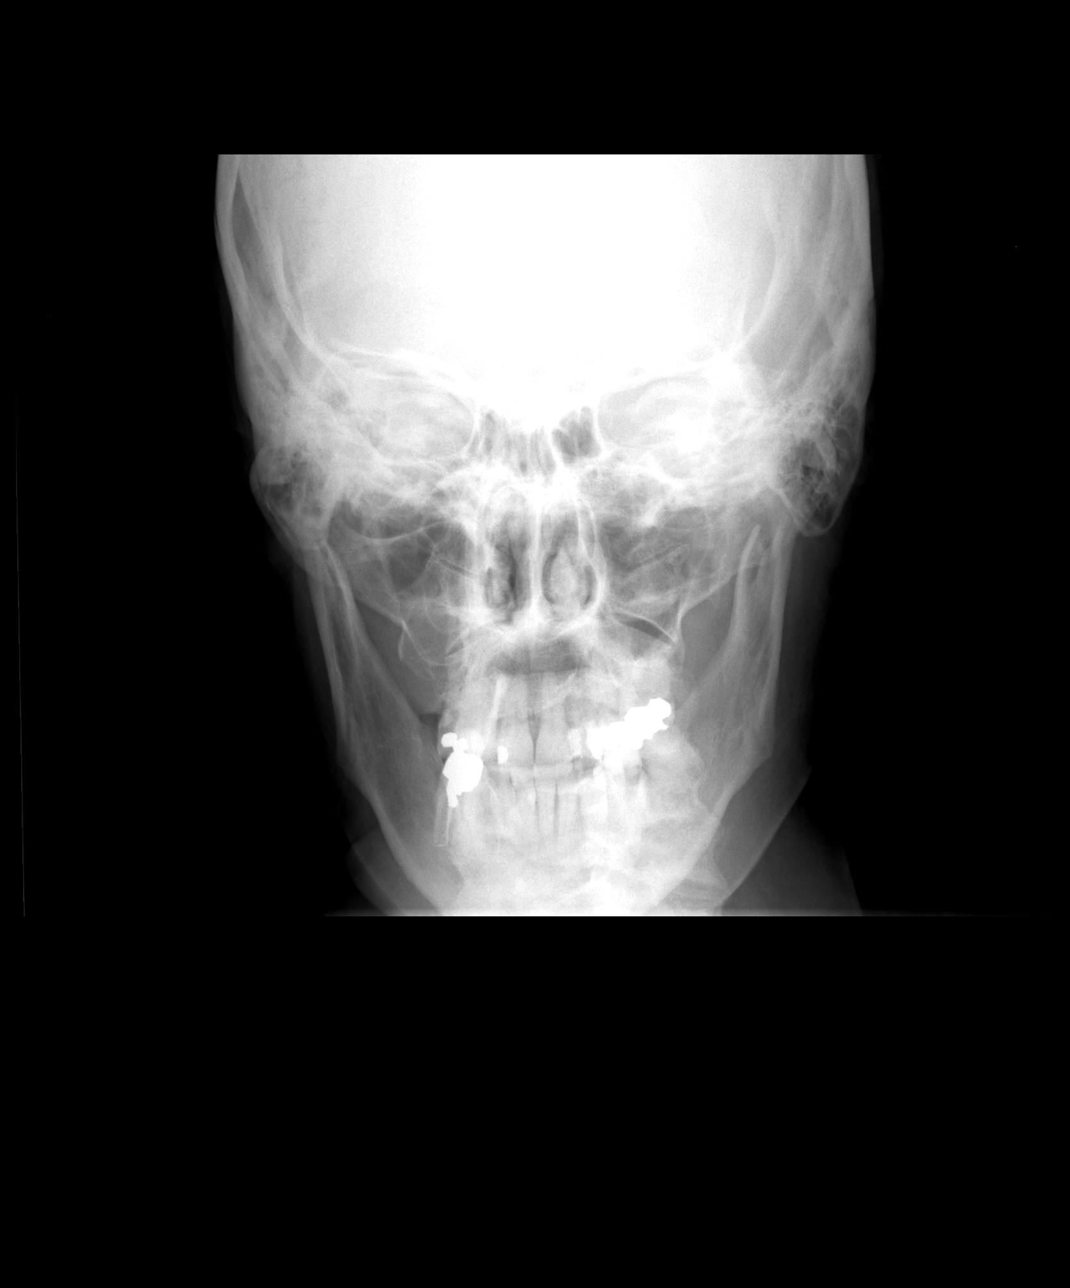

[view not recorded (4 of 4)]
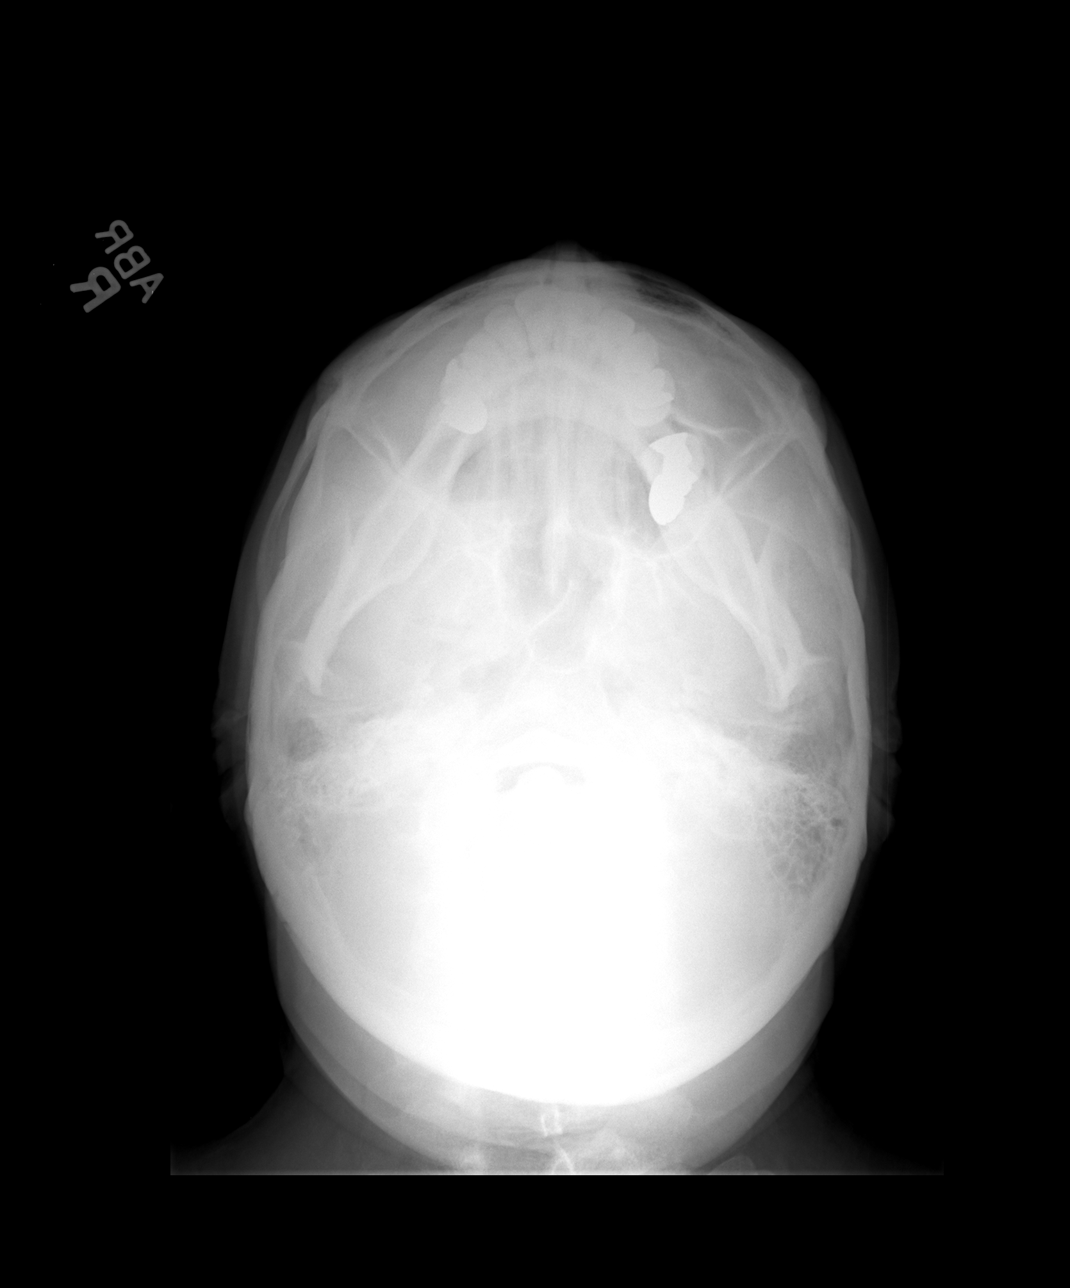

[4 of 4 positions shown; findings below may reference images not displayed]

FINDINGS: There is partial opacification of the right maxillary sinus
suggesting either mucosal thickening are of fluid level. On the
lateral view, this appears to the represent fluid with the meniscus.
Frontal sinuses appear normally aerated. Mastoid air cells grossly
appear normal. Ethmoid air cells appear normal.
IMPRESSION: Fluid level in the right maxillary sinus commonly associated with
acute sinusitis.

## 2015-06-06 IMAGING — NM NM MYOCAR SINGLE W/SPECT W/WALL MOTION & EF
2 series · 12 of 12 positions shown · non-contrast
Comparison: none

CLINICAL DATA: 47-year-old woman with history of tobacco abuse and
hyperlipidemia, presenting with atypical chest pain. This study is
requested to evaluate for the presence of ischemia.

EXAM:
MYOCARDIAL IMAGING WITH SPECT (REST AND EXERCISE)
GATED LEFT VENTRICULAR WALL MOTION STUDY
LEFT VENTRICULAR EJECTION FRACTION
TECHNIQUE: Standard myocardial SPECT imaging was performed after resting
intravenous injection of 10 mCi Rc-55m sestamibi. Subsequently,
exercise tolerance test was performed by the patient under the
supervision of the Cardiology staff. At peak-stress, 30 mCi Rc-55m
sestamibi was injected intravenously and standard myocardial SPECT
imaging was performed. Quantitative gated imaging was also performed
to evaluate left ventricular wall motion, and estimate left
ventricular ejection fraction.

[Series 1: rest · 8.28mm/px · 6 of 64 frames shown]
[frame 6/64]
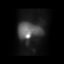
[frame 16/64]
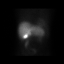
[frame 27/64]
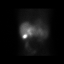
[frame 38/64]
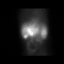
[frame 48/64]
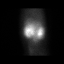
[frame 59/64]
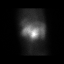

[Series 2: stress gated · 8.28mm/px · 6 of 512 frames shown]
[frame 43/512]
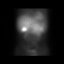
[frame 128/512]
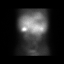
[frame 214/512]
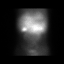
[frame 299/512]
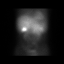
[frame 384/512]
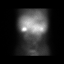
[frame 470/512]
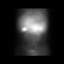

[12 of 12 positions shown; findings below may reference images not displayed]

FINDINGS: Baseline tracing shows normal sinus rhythm with decreased R wave
progression. Patient was exercised on a Bruce protocol for 9 min
achieving a maximum work load of 10.1 METS. Heart rate increased
from 75 beats per min up to 160 beats per min which was 91% of the
maximum age predicted heart rate response. Blood pressure increased
from 95/64 up to 143/62. No chest pain was reported. There were no
diagnostic ST segment abnormalities, rare PAC noted.

Analysis of the overall perfusion data finds mild breast
attenuation.

Tomographic views were obtained using the short axis, vertical long
axis, and horizontal long axis planes. Myocardial perfusion is
normal at stress, no reversible defects to indicate ischemia.

Gated imaging reveals an EDV of 58, ESV of 14, LVEF of 76%, and TID
ratio of 0.80.
IMPRESSION: Low risk exercise Cardiolite as outlined. There were no diagnostic
ST segment abnormalities. Low risk Duke treadmill score of 9.
Perfusion imaging shows no evidence of scar or ischemia with normal
LVEF of 76%.

## 2016-04-25 ENCOUNTER — Encounter: Payer: Self-pay | Admitting: Gastroenterology

## 2016-05-18 ENCOUNTER — Ambulatory Visit: Payer: Self-pay | Admitting: Nurse Practitioner

## 2017-09-07 ENCOUNTER — Other Ambulatory Visit: Payer: Self-pay

## 2017-09-07 ENCOUNTER — Inpatient Hospital Stay (HOSPITAL_COMMUNITY)
Admission: EM | Admit: 2017-09-07 | Discharge: 2017-09-11 | DRG: 387 | Disposition: A | Discharge status: Home / Self Care | Payer: Commercial Managed Care - PPO | Attending: Internal Medicine | Admitting: Internal Medicine

## 2017-09-07 ENCOUNTER — Emergency Department (HOSPITAL_COMMUNITY): Payer: Commercial Managed Care - PPO

## 2017-09-07 ENCOUNTER — Encounter (HOSPITAL_COMMUNITY): Payer: Self-pay | Admitting: Emergency Medicine

## 2017-09-07 DIAGNOSIS — K5100 Ulcerative (chronic) pancolitis without complications: Secondary | ICD-10-CM | POA: Diagnosis not present

## 2017-09-07 DIAGNOSIS — K529 Noninfective gastroenteritis and colitis, unspecified: Secondary | ICD-10-CM | POA: Diagnosis present

## 2017-09-07 DIAGNOSIS — Z888 Allergy status to other drugs, medicaments and biological substances status: Secondary | ICD-10-CM

## 2017-09-07 DIAGNOSIS — K589 Irritable bowel syndrome without diarrhea: Secondary | ICD-10-CM | POA: Diagnosis present

## 2017-09-07 DIAGNOSIS — Z79890 Hormone replacement therapy (postmenopausal): Secondary | ICD-10-CM

## 2017-09-07 DIAGNOSIS — R197 Diarrhea, unspecified: Secondary | ICD-10-CM | POA: Diagnosis not present

## 2017-09-07 DIAGNOSIS — E782 Mixed hyperlipidemia: Secondary | ICD-10-CM | POA: Diagnosis present

## 2017-09-07 DIAGNOSIS — K219 Gastro-esophageal reflux disease without esophagitis: Secondary | ICD-10-CM | POA: Diagnosis present

## 2017-09-07 DIAGNOSIS — Z79899 Other long term (current) drug therapy: Secondary | ICD-10-CM

## 2017-09-07 DIAGNOSIS — R51 Headache: Secondary | ICD-10-CM | POA: Diagnosis present

## 2017-09-07 DIAGNOSIS — E876 Hypokalemia: Secondary | ICD-10-CM | POA: Diagnosis present

## 2017-09-07 DIAGNOSIS — F17210 Nicotine dependence, cigarettes, uncomplicated: Secondary | ICD-10-CM | POA: Diagnosis present

## 2017-09-07 DIAGNOSIS — D649 Anemia, unspecified: Secondary | ICD-10-CM | POA: Diagnosis present

## 2017-09-07 DIAGNOSIS — K760 Fatty (change of) liver, not elsewhere classified: Secondary | ICD-10-CM | POA: Diagnosis present

## 2017-09-07 HISTORY — DX: Gastro-esophageal reflux disease without esophagitis: K21.9

## 2017-09-07 HISTORY — DX: Noninfective gastroenteritis and colitis, unspecified: K52.9

## 2017-09-07 HISTORY — DX: Other seasonal allergic rhinitis: J30.2

## 2017-09-07 LAB — URINALYSIS, ROUTINE W REFLEX MICROSCOPIC
BILIRUBIN URINE: NEGATIVE
Glucose, UA: NEGATIVE mg/dL
Ketones, ur: 20 mg/dL — AB
LEUKOCYTES UA: NEGATIVE
Nitrite: NEGATIVE
Protein, ur: 30 mg/dL — AB
Specific Gravity, Urine: 1.013 (ref 1.005–1.030)
pH: 6 (ref 5.0–8.0)

## 2017-09-07 LAB — COMPREHENSIVE METABOLIC PANEL
ALT: 11 U/L (ref 0–44)
AST: 13 U/L — ABNORMAL LOW (ref 15–41)
Albumin: 3 g/dL — ABNORMAL LOW (ref 3.5–5.0)
Alkaline Phosphatase: 87 U/L (ref 38–126)
Anion gap: 10 (ref 5–15)
BUN: 11 mg/dL (ref 6–20)
CO2: 24 mmol/L (ref 22–32)
Calcium: 8.1 mg/dL — ABNORMAL LOW (ref 8.9–10.3)
Chloride: 100 mmol/L (ref 98–111)
Creatinine, Ser: 0.75 mg/dL (ref 0.44–1.00)
Glucose, Bld: 116 mg/dL — ABNORMAL HIGH (ref 70–99)
POTASSIUM: 2.8 mmol/L — AB (ref 3.5–5.1)
SODIUM: 134 mmol/L — AB (ref 135–145)
Total Bilirubin: 1.3 mg/dL — ABNORMAL HIGH (ref 0.3–1.2)
Total Protein: 6.7 g/dL (ref 6.5–8.1)

## 2017-09-07 LAB — CBC WITH DIFFERENTIAL/PLATELET
Basophils Absolute: 0 10*3/uL (ref 0.0–0.1)
Basophils Relative: 1 %
Eosinophils Absolute: 0.2 10*3/uL (ref 0.0–0.7)
Eosinophils Relative: 3 %
HEMATOCRIT: 31.4 % — AB (ref 36.0–46.0)
HEMOGLOBIN: 10.7 g/dL — AB (ref 12.0–15.0)
LYMPHS PCT: 14 %
Lymphs Abs: 1 10*3/uL (ref 0.7–4.0)
MCH: 31.1 pg (ref 26.0–34.0)
MCHC: 34.1 g/dL (ref 30.0–36.0)
MCV: 91.3 fL (ref 78.0–100.0)
Monocytes Absolute: 0.7 10*3/uL (ref 0.1–1.0)
Monocytes Relative: 9 %
NEUTROS PCT: 73 %
Neutro Abs: 5.7 10*3/uL (ref 1.7–7.7)
Platelets: 267 10*3/uL (ref 150–400)
RBC: 3.44 MIL/uL — AB (ref 3.87–5.11)
RDW: 13.6 % (ref 11.5–15.5)
WBC: 7.6 10*3/uL (ref 4.0–10.5)

## 2017-09-07 LAB — LIPASE, BLOOD: Lipase: 18 U/L (ref 11–51)

## 2017-09-07 LAB — PHOSPHORUS: Phosphorus: 2.8 mg/dL (ref 2.5–4.6)

## 2017-09-07 LAB — MAGNESIUM: Magnesium: 1.7 mg/dL (ref 1.7–2.4)

## 2017-09-07 MED ORDER — SODIUM CHLORIDE 0.9 % IV BOLUS
1000.0000 mL | Freq: Once | INTRAVENOUS | Status: AC
  Administered 2017-09-07: 1000 mL via INTRAVENOUS

## 2017-09-07 MED ORDER — ACETAMINOPHEN 325 MG PO TABS
650.0000 mg | ORAL_TABLET | Freq: Four times a day (QID) | ORAL | Status: DC | PRN
  Administered 2017-09-08 – 2017-09-09 (×2): 650 mg via ORAL
  Filled 2017-09-07 (×2): qty 2

## 2017-09-07 MED ORDER — METOCLOPRAMIDE HCL 5 MG/ML IJ SOLN
INTRAMUSCULAR | Status: AC
  Filled 2017-09-07: qty 2

## 2017-09-07 MED ORDER — METRONIDAZOLE IN NACL 5-0.79 MG/ML-% IV SOLN
500.0000 mg | Freq: Once | INTRAVENOUS | Status: AC
  Administered 2017-09-07: 500 mg via INTRAVENOUS
  Filled 2017-09-07: qty 100

## 2017-09-07 MED ORDER — MAGNESIUM SULFATE 2 GM/50ML IV SOLN
2.0000 g | Freq: Once | INTRAVENOUS | Status: AC
  Administered 2017-09-07: 2 g via INTRAVENOUS
  Filled 2017-09-07: qty 50

## 2017-09-07 MED ORDER — IOPAMIDOL (ISOVUE-300) INJECTION 61%
100.0000 mL | Freq: Once | INTRAVENOUS | Status: AC | PRN
  Administered 2017-09-07: 100 mL via INTRAVENOUS

## 2017-09-07 MED ORDER — SODIUM CHLORIDE 0.9 % IV SOLN
INTRAVENOUS | Status: DC
  Administered 2017-09-07: 17:00:00 via INTRAVENOUS

## 2017-09-07 MED ORDER — ACETAMINOPHEN 650 MG RE SUPP: 650.0000 mg | Freq: Four times a day (QID) | RECTAL | Status: DC | PRN

## 2017-09-07 MED ORDER — ONDANSETRON HCL 4 MG/2ML IJ SOLN
4.0000 mg | Freq: Once | INTRAMUSCULAR | Status: AC
  Administered 2017-09-07: 4 mg via INTRAVENOUS
  Filled 2017-09-07: qty 2

## 2017-09-07 MED ORDER — POTASSIUM CHLORIDE IN NACL 20-0.9 MEQ/L-% IV SOLN
INTRAVENOUS | Status: AC
  Administered 2017-09-07: 1000 mL via INTRAVENOUS

## 2017-09-07 MED ORDER — POTASSIUM CHLORIDE IN NACL 20-0.9 MEQ/L-% IV SOLN
Freq: Once | INTRAVENOUS | Status: AC
  Administered 2017-09-07: 18:00:00 via INTRAVENOUS
  Filled 2017-09-07: qty 1000

## 2017-09-07 MED ORDER — CIPROFLOXACIN IN D5W 400 MG/200ML IV SOLN
400.0000 mg | Freq: Once | INTRAVENOUS | Status: AC
  Administered 2017-09-07: 400 mg via INTRAVENOUS
  Filled 2017-09-07: qty 200

## 2017-09-07 MED ORDER — METOCLOPRAMIDE HCL 5 MG/ML IJ SOLN
20.0000 mg | Freq: Once | INTRAVENOUS | Status: AC
  Administered 2017-09-07: 20 mg via INTRAVENOUS
  Filled 2017-09-07: qty 4

## 2017-09-07 MED ORDER — FAMOTIDINE IN NACL 20-0.9 MG/50ML-% IV SOLN
20.0000 mg | Freq: Two times a day (BID) | INTRAVENOUS | Status: DC
  Administered 2017-09-07 – 2017-09-08 (×2): 20 mg via INTRAVENOUS
  Filled 2017-09-07 (×2): qty 50

## 2017-09-07 MED ORDER — PROMETHAZINE HCL 25 MG/ML IJ SOLN
25.0000 mg | Freq: Four times a day (QID) | INTRAMUSCULAR | Status: DC | PRN
  Administered 2017-09-08 – 2017-09-09 (×2): 25 mg via INTRAVENOUS
  Filled 2017-09-07 (×2): qty 1

## 2017-09-07 MED ORDER — POTASSIUM CHLORIDE IN NACL 20-0.9 MEQ/L-% IV SOLN
INTRAVENOUS | Status: AC
  Administered 2017-09-08: 01:00:00 via INTRAVENOUS

## 2017-09-07 MED ORDER — POTASSIUM CHLORIDE IN NACL 20-0.9 MEQ/L-% IV SOLN
INTRAVENOUS | Status: AC
  Administered 2017-09-07: 1000 mL via INTRAVENOUS
  Filled 2017-09-07: qty 1000

## 2017-09-07 MED ORDER — METHYLPREDNISOLONE SODIUM SUCC 125 MG IJ SOLR
125.0000 mg | Freq: Once | INTRAMUSCULAR | Status: AC
  Administered 2017-09-07: 125 mg via INTRAVENOUS
  Filled 2017-09-07: qty 2

## 2017-09-07 NOTE — ED Notes (Signed)
AC called to bring Reglan IVPB.

## 2017-09-07 NOTE — H&P (Signed)
History and Physical    Christina Richardson XQJ:[id number] DOB: Aug 23, 1965 DOA: 09/07/2017  PCP: Loman Brooklyn, FNP   Patient coming from: Home.  I have personally briefly reviewed patient's old medical records in [place]  Chief Complaint: Diarrhea, frequent nausea and one episode of emesis.  HPI: Christina Richardson is a 52 y.o. female with medical history significant of gastroenteritis, GERD, mixed hyperlipidemia, seasonal allergies who is coming with complaints of exacerbation of chronic diarrhea, frequent nausea and one episode of emesis Monday.   Per patient, she has had on/off episodes of diarrhea for the past two years. She has been worked up by her PCP including C.diff, other bacterial pathogens and parasites without finding any specific etiology. She is scheduled to see Dr Alessandra Bevels from [facility] in 10 days. However, since Monday her symptoms have worsened significantly. She started having nausea in the morning which was followed by a single episode of emesis. She has not vomited anymore since then. However, her diarrhea has worsened. She is having about 8 BMs a day and frequently has BRBPR. BMs are frequently preceded by intense abdominal pain, which gets significantly better or goes away after having a BM. She denies dysuria, frequency or gross hematuria. She has had fever and chills. She has had headache, but denies sore throat and rhinorrhea. No dyspnea, wheezing or hemoptysis. No CP, palpitations, diaphoresis, PND, orthopnea or pitting edema of the lower extremities. No heat or cold intolerance. Denies polyuria, polydipsia, polyphagia or blurred vision. She has had some pruritus of thighs and right retroauricular temporo-occipital area.  .  ED Course: Initial vital signs temperature 98.1 F, pulse 125, respirations 20, blood pressure 112/48 mmHg and O2 sat 96% on room air. The patient received a 1000 mL of NS bolus, and 1000 NS plus KCl 20 mEq bolus, ciprofloxacin 400 mg IVPB  x1, metronidazole 500 mg IVPB x1, ondansetron 4 mg IVP x1 and Reglan 20 mg infusion x1.  I added magnesium sulfate 2 g IVPB.  Her urinalysis shows small hemoglobinuria, ketones of 20 and protein of 30 mg/dL.  Microscopic was only significant for rare bacteria.  White count 7.6 with 73% neutrophils, 14% lymphocytes and 9% monocytes.  Hemoglobin 10.7 g/dL and platelets 267.  Sodium 134, potassium 2.8, chloride 100 and CO2 24 mmol/L.  BUN was 11, creatinine 0.75, glucose 116, calcium 8.1, magnesium 1.7 and phosphorus 2.8 mg/dL.  Total protein was 6.7 and albumin was 3.0 g/dL.  AST was 13, ALT 11, alkaline phosphatase 87 and lipase 18 units/L.  Total bilirubin was 1.3 mg/dL.    Imaging: CT abdomen/pelvis with contrast showed diffuse pancolitis with no bowel perforation or obstruction.  There were bilateral nonspecific adrenal nodules.  Please see images and full radiology report for further detail.  Review of Systems: As per HPI otherwise 10 point review of systems negative.   Past Medical History:  Diagnosis Date  . Gastroenteritis   . GERD (gastroesophageal reflux disease)   . Mixed hyperlipidemia   . Seasonal allergies     Past Surgical History:  Procedure Laterality Date  . ABDOMINAL HYSTERECTOMY     With oopherectomy     reports that she has been smoking cigarettes. She has smoked for the past 30.00 years. She has never used smokeless tobacco. She reports that she does not drink alcohol or use drugs.  Allergies  Allergen Reactions  . Nicotine Rash and Other (See Comments)    Patch causes Nightmares     Family History  Problem Relation Age of Onset  . Heart attack Mother   . Stroke Mother   . Diabetes Mother   . Stroke Maternal Grandmother   . Diabetes Maternal Grandmother     Prior to Admission medications   Medication Sig Start Date End Date Taking? Authorizing Provider  atorvastatin (LIPITOR) 10 MG tablet Take 10 mg by mouth daily.  08/12/17  Yes [provider]    colestipol (COLESTID) 1 g tablet Take 1 g by mouth 2 (two) times daily.  08/29/17  Yes [provider]  dicyclomine (BENTYL) 10 MG capsule Take 10 mg by mouth 2 (two) times daily as needed for spasms.  08/15/17  Yes [provider]  hydrocortisone (ANUSOL-HC) 2.5 % rectal cream Place 1 application rectally daily as needed for hemorrhoids or anal itching.    Yes [provider]  hyoscyamine (LEVSIN SL) 0.125 MG SL tablet Take 0.125 mg by mouth 2 (two) times daily as needed for cramping.  08/29/17  Yes [provider]  omeprazole (PRILOSEC) 40 MG capsule Take 1 capsule (40 mg total) by mouth daily. 06/07/13  Yes Viyuoh, Adeline C, MD  ondansetron (ZOFRAN) 4 MG tablet Take 4 mg by mouth every 8 (eight) hours as needed for nausea or vomiting.  08/15/17  Yes [provider]  SF 5000 PLUS 1.1 % CREA dental cream Place 1 application onto teeth See admin instructions. Brush two to three times daily-Spit out excess 06/01/17  Yes [provider]  PLENVU 140 g SOLR Take by mouth See admin instructions.  08/30/17   [provider]  PREMARIN 0.625 MG tablet Take 0.625 mg by mouth daily. 08/27/17   [provider]    Physical Exam: Vitals:   09/07/17 1900 09/07/17 2010 09/07/17 2100 09/07/17 2145  BP: (!) 110/54 107/63 102/61 109/69  Pulse: (!) 115 (!) 121 (!) 120 (!) 115  Resp: 19 (!) 29 18 (!) 22  Temp:      TempSrc:      SpO2: (!) 86% 96% 99% 95%    Constitutional: NAD, calm, comfortable Eyes: PERRL, lids and conjunctivae normal ENMT: Mucous membranes are mildly dry. Posterior pharynx clear of any exudate or lesions. Neck: normal, supple, no masses, no thyromegaly Respiratory: Clear to auscultation bilaterally, no wheezing, no crackles. Normal respiratory effort. No accessory muscle use.  Cardiovascular: Tachycardic at 103 BPM, no murmurs / rubs / gallops. No extremity edema. 2+ pedal pulses. No carotid bruits.  Abdomen: Bowel sounds  positive. Soft, mild epigastric tenderness, no masses palpated. No hepatosplenomegaly.  Musculoskeletal: no clubbing / cyanosis. No joint deformity upper and lower extremities. Good ROM, no contractures. Normal muscle tone.  Skin: Small area of mild erythema on medial calves area Neurologic: CN 2-12 grossly intact. Sensation intact, DTR normal. Strength 5/5 in all 4.  Psychiatric: Normal judgment and insight. Alert and oriented x 4. Normal mood.   Labs on Admission: I have personally reviewed following labs and imaging studies  CBC: Recent Labs  Lab 09/07/17 1651  WBC 7.6  NEUTROABS 5.7  HGB 10.7*  HCT 31.4*  MCV 91.3  PLT 938   Basic Metabolic Panel: Recent Labs  Lab 09/07/17 1635 09/07/17 1651  NA  --  134*  K  --  2.8*  CL  --  100  CO2  --  24  GLUCOSE  --  116*  BUN  --  11  CREATININE  --  0.75  CALCIUM  --  8.1*  MG 1.7  --  PHOS 2.8  --    GFR: CrCl cannot be calculated (Unknown ideal weight.). Liver Function Tests: Recent Labs  Lab 09/07/17 1651  AST 13*  ALT 11  ALKPHOS 87  BILITOT 1.3*  PROT 6.7  ALBUMIN 3.0*   Recent Labs  Lab 09/07/17 1651  LIPASE 18   No results for input(s): AMMONIA in the last 168 hours. Coagulation Profile: No results for input(s): INR, PROTIME in the last 168 hours. Cardiac Enzymes: No results for input(s): CKTOTAL, CKMB, CKMBINDEX, TROPONINI in the last 168 hours. BNP (last 3 results) No results for input(s): PROBNP in the last 8760 hours. HbA1C: No results for input(s): HGBA1C in the last 72 hours. CBG: No results for input(s): GLUCAP in the last 168 hours. Lipid Profile: No results for input(s): CHOL, HDL, LDLCALC, TRIG, CHOLHDL, LDLDIRECT in the last 72 hours. Thyroid Function Tests: No results for input(s): TSH, T4TOTAL, FREET4, T3FREE, THYROIDAB in the last 72 hours. Anemia Panel: No results for input(s): VITAMINB12, FOLATE, FERRITIN, TIBC, IRON, RETICCTPCT in the last 72 hours. Urine analysis:      Component Value Date/Time   COLORURINE YELLOW 09/07/2017 1635   APPEARANCEUR HAZY (A) 09/07/2017 1635   LABSPEC 1.013 09/07/2017 1635   PHURINE 6.0 09/07/2017 1635   GLUCOSEU NEGATIVE 09/07/2017 1635   HGBUR SMALL (A) 09/07/2017 1635   BILIRUBINUR NEGATIVE 09/07/2017 1635   KETONESUR 20 (A) 09/07/2017 1635   PROTEINUR 30 (A) 09/07/2017 1635   UROBILINOGEN 0.2 06/06/2013 1554   NITRITE NEGATIVE 09/07/2017 1635   LEUKOCYTESUR NEGATIVE 09/07/2017 1635    Radiological Exams on Admission: Ct Abdomen Pelvis W Contrast  Result Date: 09/07/2017 CLINICAL DATA:  Gastroenteritis and diarrhea x3 weeks with fever. EXAM: CT ABDOMEN AND PELVIS WITH CONTRAST TECHNIQUE: Multidetector CT imaging of the abdomen and pelvis was performed using the standard protocol following bolus administration of intravenous contrast. CONTRAST:  145mL ISOVUE-300 IOPAMIDOL (ISOVUE-300) INJECTION 61% COMPARISON:  None. FINDINGS: Lower chest: No acute abnormality. Hepatobiliary: Fatty infiltration along the falciform. No space-occupying mass of the liver. The gallbladder is physiologically distended without calculi. No biliary dilatation. Pancreas: Normal Spleen: Normal Adrenals/Urinary Tract: Bilateral adrenal nodules measuring 2.2 x 1.3 cm on the right and 0.8 x 0.7 cm on the left. Symmetric cortical enhancement of both kidneys. No nephrolithiasis nor obstructive uropathy. No hydroureteronephrosis. The urinary bladder is unremarkable for the degree of distention. Stomach/Bowel: Physiologically distended stomach. The duodenal sweep and ligament of Treitz are normal. No significant small bowel dilatation or inflammation. There is diffuse transmural thickening of the colon consistent with pancolitis. No bowel perforation or obstruction. What appears to be appendix is slightly fluid-filled and likely sympathetic secondary to the underlying colitis. Vascular/Lymphatic: Moderate aortoiliac atherosclerosis. No lymphadenopathy by CT size  criteria. Small retroperitoneal lymph nodes and retroperitoneal fatty induration is identified, nonspecific. Reproductive: Hysterectomy.  No adnexal mass. Other: No free air.  No free fluid. Musculoskeletal: No acute or significant osseous findings. IMPRESSION: Diffuse pancolitis is identified. No bowel perforation or obstruction. Bilateral nonspecific adrenal nodules measuring 2.2 x 1.3 cm on the right and 0.8 x 0.7 cm on left. Electronically Signed   By: Ashley Royalty M.D.   On: 09/07/2017 21:40    EKG: Independently reviewed.    Assessment/Plan Principal Problem:   Pancolitis (HCC) Observation/telemetry. Keep n.p.o. for now. Continue IV fluids. Continue antiemetics as needed. Analgesics as needed. Ciprofloxacin 400 mg IVPB every 12 hours. Continue metronidazole 500 mg IVPB every 8 hours.  Active Problems:   GERD (gastroesophageal reflux disease)  Famotidine 20 mg IVP every 12 hours while n.p.o.    Mixed hyperlipidemia Hold statin for now. Resume once the patient is tolerating oral intake.    Anemia Check anemia panel. Follow-up hematocrit and hemoglobin.    Hypokalemia Replacing. Follow-up potassium level.    Hyperbilirubinemia Continue IV hydration. Follow-up bilirubin level.   DVT prophylaxis: On Eliquis. Code Status: Full code. Family Communication:  Disposition Plan: Observation for IV hydration Consults called:  Admission status: Observation/Telemetry.   Reubin Milan MD [facility] Pager [phone].  If 7PM-7AM, please contact night-coverage [email or web address] Password [facility]  09/07/2017, 10:51 PM

## 2017-09-07 NOTE — ED Notes (Signed)
Pt states she does not need to urinate at this time, aware of DO  

## 2017-09-07 NOTE — ED Notes (Signed)
Pt assisted to the bathroom and back to bed

## 2017-09-07 NOTE — ED Provider Notes (Signed)
[facility] EMERGENCY DEPARTMENT Provider Note   CSN: [id number] Arrival date & time: 09/07/17  1626     History   Chief Complaint Chief Complaint  Patient presents with  . Fever  . Emesis  . Diarrhea    HPI Christina Richardson is a 52 y.o. female.  HPI Presents concern of nausea, vomiting, diarrhea. Patient has had episodes going back to years, and she is in the midst of a more severe events than usual. Abdominal pain is in the upper abdomen, not improved with OTC medication. She is preparing for evaluation by gastroenterology next week. Today she complains of after mentioned concerns as well as generalized weakness. She has been intolerant of oral intake. She is here with her husband who assists with the HPI.   Past Medical History:  Diagnosis Date  . Gastroenteritis   . GERD (gastroesophageal reflux disease)   . Mixed hyperlipidemia   . Seasonal allergies     Patient Active Problem List   Diagnosis Date Noted  . Vasovagal near syncope 06/07/2013  . Chest pain 06/06/2013    Past Surgical History:  Procedure Laterality Date  . ABDOMINAL HYSTERECTOMY     With oopherectomy     OB History   None      Home Medications    Prior to Admission medications   Medication Sig Start Date End Date Taking? Authorizing Provider  atorvastatin (LIPITOR) 10 MG tablet Take 10 mg by mouth daily.  08/12/17  Yes [provider]  colestipol (COLESTID) 1 g tablet Take 1 g by mouth 2 (two) times daily.  08/29/17  Yes [provider]  dicyclomine (BENTYL) 10 MG capsule Take 10 mg by mouth 2 (two) times daily as needed for spasms.  08/15/17  Yes [provider]  hydrocortisone (ANUSOL-HC) 2.5 % rectal cream Place 1 application rectally daily as needed for hemorrhoids or anal itching.    Yes [provider]  hyoscyamine (LEVSIN SL) 0.125 MG SL tablet Take 0.125 mg by mouth 2 (two) times daily as needed for cramping.  08/29/17  Yes [provider]  omeprazole (PRILOSEC) 40 MG capsule Take 1 capsule (40 mg total) by mouth daily. 06/07/13  Yes Viyuoh, Adeline C, MD  ondansetron (ZOFRAN) 4 MG tablet Take 4 mg by mouth every 8 (eight) hours as needed for nausea or vomiting.  08/15/17  Yes [provider]  SF 5000 PLUS 1.1 % CREA dental cream Place 1 application onto teeth See admin instructions. Brush two to three times daily-Spit out excess 06/01/17  Yes [provider]  PLENVU 140 g SOLR Take by mouth See admin instructions.  08/30/17   [provider]  PREMARIN 0.625 MG tablet Take 0.625 mg by mouth daily. 08/27/17   [provider]    Family History Family History  Problem Relation Age of Onset  . Heart attack Mother   . Stroke Mother   . Diabetes Mother   . Stroke Maternal Grandmother   . Diabetes Maternal Grandmother     Social History Social History   Tobacco Use  . Smoking status: Current Every Day Smoker    Years: 30.00    Types: Cigarettes  . Smokeless tobacco: Never Used  Substance Use Topics  . Alcohol use: No  . Drug use: No     Allergies   Nicotine   Review of Systems Review of Systems  Constitutional:       Per HPI, otherwise negative  HENT:  Per HPI, otherwise negative  Respiratory:       Per HPI, otherwise negative  Cardiovascular:       Per HPI, otherwise negative  Gastrointestinal: Positive for abdominal pain, diarrhea, nausea and vomiting.  Endocrine:       Negative aside from HPI  Genitourinary:       Neg aside from HPI   Musculoskeletal:       Per HPI, otherwise negative  Skin: Negative.   Neurological: Negative for syncope.     Physical Exam Updated Vital Signs BP 107/63   Pulse (!) 121   Temp 98.1 F (36.7 C) (Oral)   Resp (!) 29   SpO2 96%   Physical Exam  Constitutional: She is oriented to person, place, and time. She appears well-developed and well-nourished. No distress.  HENT:  Head: Normocephalic and atraumatic.  Eyes:  Conjunctivae and EOM are normal.  Cardiovascular: Regular rhythm.  Tachycardia  Pulmonary/Chest: Effort normal and breath sounds normal. No stridor. No respiratory distress.  Abdominal: She exhibits no distension. There is no tenderness. There is no guarding.  Musculoskeletal: She exhibits no edema.  Neurological: She is alert and oriented to person, place, and time. No cranial nerve deficit.  Skin: Skin is warm and dry.  Psychiatric: She has a normal mood and affect.  Nursing note and vitals reviewed.    ED Treatments / Results  Labs (all labs ordered are listed, but only abnormal results are displayed) Labs Reviewed  COMPREHENSIVE METABOLIC PANEL - Abnormal; Notable for the following components:      Result Value   Sodium 134 (*)    Potassium 2.8 (*)    Glucose, Bld 116 (*)    Calcium 8.1 (*)    Albumin 3.0 (*)    AST 13 (*)    Total Bilirubin 1.3 (*)    All other components within normal limits  CBC WITH DIFFERENTIAL/PLATELET - Abnormal; Notable for the following components:   RBC 3.44 (*)    Hemoglobin 10.7 (*)    HCT 31.4 (*)    All other components within normal limits  URINALYSIS, ROUTINE W REFLEX MICROSCOPIC - Abnormal; Notable for the following components:   APPearance HAZY (*)    Hgb urine dipstick SMALL (*)    Ketones, ur 20 (*)    Protein, ur 30 (*)    Bacteria, UA RARE (*)    All other components within normal limits  LIPASE, BLOOD  MAGNESIUM  PHOSPHORUS  I-STAT BETA HCG BLOOD, ED ([facility], [facility], [facility] ONLY)    EKG None  Radiology Ct Abdomen Pelvis W Contrast  Result Date: 09/07/2017 CLINICAL DATA:  Gastroenteritis and diarrhea x3 weeks with fever. EXAM: CT ABDOMEN AND PELVIS WITH CONTRAST TECHNIQUE: Multidetector CT imaging of the abdomen and pelvis was performed using the standard protocol following bolus administration of intravenous contrast. CONTRAST:  145mL ISOVUE-300 IOPAMIDOL (ISOVUE-300) INJECTION 61% COMPARISON:  None. FINDINGS: Lower chest: No acute  abnormality. Hepatobiliary: Fatty infiltration along the falciform. No space-occupying mass of the liver. The gallbladder is physiologically distended without calculi. No biliary dilatation. Pancreas: Normal Spleen: Normal Adrenals/Urinary Tract: Bilateral adrenal nodules measuring 2.2 x 1.3 cm on the right and 0.8 x 0.7 cm on the left. Symmetric cortical enhancement of both kidneys. No nephrolithiasis nor obstructive uropathy. No hydroureteronephrosis. The urinary bladder is unremarkable for the degree of distention. Stomach/Bowel: Physiologically distended stomach. The duodenal sweep and ligament of Treitz are normal. No significant small bowel dilatation or inflammation. There is diffuse transmural thickening of  the colon consistent with pancolitis. No bowel perforation or obstruction. What appears to be appendix is slightly fluid-filled and likely sympathetic secondary to the underlying colitis. Vascular/Lymphatic: Moderate aortoiliac atherosclerosis. No lymphadenopathy by CT size criteria. Small retroperitoneal lymph nodes and retroperitoneal fatty induration is identified, nonspecific. Reproductive: Hysterectomy.  No adnexal mass. Other: No free air.  No free fluid. Musculoskeletal: No acute or significant osseous findings. IMPRESSION: Diffuse pancolitis is identified. No bowel perforation or obstruction. Bilateral nonspecific adrenal nodules measuring 2.2 x 1.3 cm on the right and 0.8 x 0.7 cm on left. Electronically Signed   By: Ashley Royalty M.D.   On: 09/07/2017 21:40    Procedures Procedures (including critical care time)  Medications Ordered in ED Medications  sodium chloride 0.9 % bolus 1,000 mL (0 mLs Intravenous Stopped 09/07/17 1756)    And  0.9 %  sodium chloride infusion ( Intravenous New Bag/Given 09/07/17 1656)  metoCLOPramide (REGLAN) 20 mg in dextrose 5 % 50 mL IVPB (20 mg Intravenous New Bag/Given 09/07/17 2148)  ciprofloxacin (CIPRO) IVPB 400 mg (has no administration in time range)    metroNIDAZOLE (FLAGYL) IVPB 500 mg (has no administration in time range)  methylPREDNISolone sodium succinate (SOLU-MEDROL) 125 mg/2 mL injection 125 mg (has no administration in time range)  0.9 % NaCl with KCl 20 mEq/ L  infusion (has no administration in time range)  ondansetron (ZOFRAN) injection 4 mg (4 mg Intravenous Given 09/07/17 1655)  0.9 % NaCl with KCl 20 mEq/ L  infusion ( Intravenous Stopped 09/07/17 2028)  iopamidol (ISOVUE-300) 61 % injection 100 mL (100 mLs Intravenous Contrast Given 09/07/17 2121)     Initial Impression / Assessment and Plan / ED Course  I have reviewed the triage vital signs and the nursing notes.  Pertinent labs & imaging results that were available during my care of the patient were reviewed by me and considered in my medical decision making (see chart for details).    Somewhat reassuring, aside from mild tachycardia, vital signs unremarkable   Initial labs notable for hypokalemia supplement provided  After initial bolus patient's heart rate has diminished somewhat, but she remains symptomatic.  Update: Patient has completed 1.5 L fluid resuscitation including half liter with potassium repletion. Patient has had a recurrence of her symptoms, but now feels more uncomfortable, with additional nausea, and heart rate has increased CT scan ordered  9:56 PM ET consistent with pancolitis Given the patient's history of possible IBS, though without complete diagnosis, this may be consistent with that, though given the persistent nausea, p.o. intolerance, infectious etiology also is a consideration. Patient will receive broad antibiotics with Cipro, Flagyl, continued fluid resuscitation, and Reglan. Notably, patient has previously had relief during episodes such as this with defecation, and this may be a side effect it is beneficial to Reglan. Given the persistent tachycardia, tachypnea, in spite of 2 L fluid resuscitation, need for ongoing fluids, symptom  control, infection control, the patient was admitted for further evaluation and management.   Final Clinical Impressions(s) / ED Diagnoses  Pancolitis   Carmin Muskrat, MD 09/07/17 2158

## 2017-09-07 NOTE — ED Triage Notes (Signed)
Pt has gastroenterits. Diarrhea x 3 weeks. Fever  4 days. Mild paleness noted. Pt states she feels dehydrated. Nausea. Vomited Monday morning but none since. Tired, loss of appetite and ha

## 2017-09-08 ENCOUNTER — Other Ambulatory Visit: Payer: Self-pay

## 2017-09-08 ENCOUNTER — Encounter (HOSPITAL_COMMUNITY): Payer: Self-pay

## 2017-09-08 DIAGNOSIS — K5100 Ulcerative (chronic) pancolitis without complications: Secondary | ICD-10-CM | POA: Diagnosis not present

## 2017-09-08 LAB — COMPREHENSIVE METABOLIC PANEL
ALK PHOS: 70 U/L (ref 38–126)
ALT: 12 U/L (ref 0–44)
ANION GAP: 8 (ref 5–15)
AST: 10 U/L — ABNORMAL LOW (ref 15–41)
Albumin: 2.5 g/dL — ABNORMAL LOW (ref 3.5–5.0)
BUN: 6 mg/dL (ref 6–20)
CALCIUM: 7.3 mg/dL — AB (ref 8.9–10.3)
CO2: 22 mmol/L (ref 22–32)
Chloride: 109 mmol/L (ref 98–111)
Creatinine, Ser: 0.66 mg/dL (ref 0.44–1.00)
GFR calc Af Amer: >60 mL/min (ref >60)
GFR calc non Af Amer: >60 mL/min (ref >60)
GLUCOSE: 172 mg/dL — AB (ref 70–99)
Potassium: 3.4 mmol/L — ABNORMAL LOW (ref 3.5–5.1)
SODIUM: 139 mmol/L (ref 135–145)
Total Bilirubin: 0.9 mg/dL (ref 0.3–1.2)
Total Protein: 5.7 g/dL — ABNORMAL LOW (ref 6.5–8.1)

## 2017-09-08 LAB — CBC WITH DIFFERENTIAL/PLATELET
BASOS ABS: 0 10*3/uL (ref 0.0–0.1)
BASOS PCT: 0 %
EOS ABS: 0 10*3/uL (ref 0.0–0.7)
Eosinophils Relative: 0 %
HCT: 27.4 % — ABNORMAL LOW (ref 36.0–46.0)
HEMOGLOBIN: 9.1 g/dL — AB (ref 12.0–15.0)
Lymphocytes Relative: 8 %
Lymphs Abs: 0.5 10*3/uL — ABNORMAL LOW (ref 0.7–4.0)
MCH: 30.6 pg (ref 26.0–34.0)
MCHC: 33.2 g/dL (ref 30.0–36.0)
MCV: 92.3 fL (ref 78.0–100.0)
Monocytes Absolute: 0.2 10*3/uL (ref 0.1–1.0)
Monocytes Relative: 3 %
NEUTROS PCT: 89 %
Neutro Abs: 6.2 10*3/uL (ref 1.7–7.7)
Platelets: 233 10*3/uL (ref 150–400)
RBC: 2.97 MIL/uL — AB (ref 3.87–5.11)
RDW: 13.7 % (ref 11.5–15.5)
WBC: 6.9 10*3/uL (ref 4.0–10.5)

## 2017-09-08 LAB — MRSA PCR SCREENING: MRSA by PCR: NEGATIVE

## 2017-09-08 MED ORDER — FENTANYL CITRATE (PF) 100 MCG/2ML IJ SOLN: 50.0000 ug | INTRAMUSCULAR | Status: DC | PRN

## 2017-09-08 MED ORDER — PANTOPRAZOLE SODIUM 40 MG PO TBEC
40.0000 mg | DELAYED_RELEASE_TABLET | Freq: Every day | ORAL | Status: DC
  Administered 2017-09-09 – 2017-09-11 (×3): 40 mg via ORAL
  Filled 2017-09-08 (×4): qty 1

## 2017-09-08 MED ORDER — METHYLPREDNISOLONE SODIUM SUCC 40 MG IJ SOLR
20.0000 mg | Freq: Three times a day (TID) | INTRAMUSCULAR | Status: DC
  Administered 2017-09-08 – 2017-09-09 (×3): 20 mg via INTRAVENOUS
  Filled 2017-09-08 (×3): qty 1

## 2017-09-08 MED ORDER — ATORVASTATIN CALCIUM 10 MG PO TABS
10.0000 mg | ORAL_TABLET | Freq: Every day | ORAL | Status: DC
  Administered 2017-09-08 – 2017-09-11 (×4): 10 mg via ORAL
  Filled 2017-09-08 (×4): qty 1

## 2017-09-08 MED ORDER — CIPROFLOXACIN IN D5W 400 MG/200ML IV SOLN
400.0000 mg | Freq: Two times a day (BID) | INTRAVENOUS | Status: DC
  Administered 2017-09-08 – 2017-09-11 (×7): 400 mg via INTRAVENOUS
  Filled 2017-09-08 (×7): qty 200

## 2017-09-08 MED ORDER — METRONIDAZOLE IN NACL 5-0.79 MG/ML-% IV SOLN
500.0000 mg | Freq: Three times a day (TID) | INTRAVENOUS | Status: DC
  Administered 2017-09-08 – 2017-09-11 (×10): 500 mg via INTRAVENOUS
  Filled 2017-09-08 (×11): qty 100

## 2017-09-08 NOTE — Progress Notes (Signed)
Patient seen and examined. CT imaging Reviewed. Patient most likely has new onset IBD  - ulcerative colitis . Condition exacerbated by patient taking on the order of 1600 mg of ibuprofen about 3x weekly for low back pain and 2 Aleve almost every night for chronic headache. Will start on scheduled IV Solu- Medrol.  Hold resin binders and anti-motility  Agents.  Consider Flexible sigmoidoscopy with biopsy in 48-72 hours if no clinical improvement. Patients slated to have a diagnostic  Colonoscopy on September 10.  This needs to be done but timing may  Need to be adjusted.

## 2017-09-08 NOTE — Progress Notes (Signed)
PROGRESS NOTE    Christina Richardson  WLS:[id number] DOB: 03-02-1965 DOA: 09/07/2017 PCP: Loman Brooklyn, FNP   Brief Narrative:   Christina Richardson is a 52 y.o. female with medical history significant of gastroenteritis, GERD, mixed hyperlipidemia, seasonal allergies who is coming with complaints of exacerbation of chronic diarrhea, frequent nausea and one episode of emesis Monday.  She is noted to have frequent, intermittent episodes of diarrhea over the past 2 years with negative outpatient work-up noted thus far.  She was noted to have pancolitis on abdominal CT for which she has been started on IV ciprofloxacin and Flagyl.  She has been seen by GI with concern for new onset IBD with ulcerative colitis for which she has been started on IV steroids with plans for flex sigmoidoscopy as needed.  Assessment & Plan:   Principal Problem:   Pancolitis ([place]) Active Problems:   GERD (gastroesophageal reflux disease)   Mixed hyperlipidemia   Anemia   Hypokalemia   Hyperbilirubinemia  1. Pancolitis.  Associated with likely new onset IBD with ulcerative colitis.  Appreciate GI recommendations with scheduled IV steroids to be initiated.  Continue on IV fluids as well as antiemetics as well as ciprofloxacin and Flagyl IV.  Maintain on clear liquid diet and advance as tolerated. 2. GERD.  May switch IV famotidine to p.o. Protonix as she can tolerate clears. 3. Dyslipidemia.  Resume statin. 4. Anemia.  Follow-up CBC in a.m. with anemia panel pending. 5. Hypokalemia.  Undergoing IV replacement and will schedule.  Time with follow-up labs in a.m. 6. Hyperbilirubinemia.  Continue IV hydration and monitor CMP in a.m.    DVT prophylaxis:SCDs Code Status: Full Family Communication: None at bedside Disposition Plan: IV hydration and abx treatment   Consultants:   GI  Procedures:   None  Antimicrobials:   Cipro and Flagyl 8/29->   Subjective: Patient seen and evaluated today with no new  acute complaints or concerns. No acute concerns or events noted overnight.  She has not had any more loose stools or nausea or vomiting.  Objective: Vitals:   09/08/17 0600 09/08/17 0700 09/08/17 0827 09/08/17 0900  BP: 97/63 123/81    Pulse: 86 90  93  Resp: 20 (!) 22  (!) 21  Temp:   98 F (36.7 C)   TempSrc:   Oral   SpO2: 92% 96%  98%  Weight:      Height:        Intake/Output Summary (Last 24 hours) at 09/08/2017 1127 Last data filed at 09/08/2017 0914 Gross per 24 hour  Intake 5981.65 ml  Output -  Net 5981.65 ml   Filed Weights   09/08/17 0053 09/08/17 0500  Weight: 71.5 kg 71.5 kg    Examination:  General exam: Appears calm and comfortable  Respiratory system: Clear to auscultation. Respiratory effort normal. Cardiovascular system: S1 & S2 heard, RRR. No JVD, murmurs, rubs, gallops or clicks. No pedal edema. Gastrointestinal system: Abdomen is nondistended, soft and nontender. No organomegaly or masses felt. Normal bowel sounds heard. Central nervous system: Alert and oriented. No focal neurological deficits. Extremities: Symmetric 5 x 5 power. Skin: No rashes, lesions or ulcers Psychiatry: Judgement and insight appear normal. Mood & affect appropriate.     Data Reviewed: I have personally reviewed following labs and imaging studies  CBC: Recent Labs  Lab 09/07/17 1651 09/08/17 0353  WBC 7.6 6.9  NEUTROABS 5.7 6.2  HGB 10.7* 9.1*  HCT 31.4* 27.4*  MCV 91.3 92.3  PLT 267  992   Basic Metabolic Panel: Recent Labs  Lab 09/07/17 1635 09/07/17 1651 09/08/17 0353  NA  --  134* 139  K  --  2.8* 3.4*  CL  --  100 109  CO2  --  24 22  GLUCOSE  --  116* 172*  BUN  --  11 6  CREATININE  --  0.75 0.66  CALCIUM  --  8.1* 7.3*  MG 1.7  --   --   PHOS 2.8  --   --    GFR: Estimated Creatinine Clearance: 88.9 mL/min (by C-G formula based on SCr of 0.66 mg/dL). Liver Function Tests: Recent Labs  Lab 09/07/17 1651 09/08/17 0353  AST 13* 10*  ALT 11  12  ALKPHOS 87 70  BILITOT 1.3* 0.9  PROT 6.7 5.7*  ALBUMIN 3.0* 2.5*   Recent Labs  Lab 09/07/17 1651  LIPASE 18   No results for input(s): AMMONIA in the last 168 hours. Coagulation Profile: No results for input(s): INR, PROTIME in the last 168 hours. Cardiac Enzymes: No results for input(s): CKTOTAL, CKMB, CKMBINDEX, TROPONINI in the last 168 hours. BNP (last 3 results) No results for input(s): PROBNP in the last 8760 hours. HbA1C: No results for input(s): HGBA1C in the last 72 hours. CBG: No results for input(s): GLUCAP in the last 168 hours. Lipid Profile: No results for input(s): CHOL, HDL, LDLCALC, TRIG, CHOLHDL, LDLDIRECT in the last 72 hours. Thyroid Function Tests: No results for input(s): TSH, T4TOTAL, FREET4, T3FREE, THYROIDAB in the last 72 hours. Anemia Panel: No results for input(s): VITAMINB12, FOLATE, FERRITIN, TIBC, IRON, RETICCTPCT in the last 72 hours. Sepsis Labs: No results for input(s): PROCALCITON, LATICACIDVEN in the last 168 hours.  Recent Results (from the past 240 hour(s))  MRSA PCR Screening     Status: None   Collection Time: 09/08/17 12:45 AM  Result Value Ref Range Status   MRSA by PCR NEGATIVE NEGATIVE Final    Comment:        The GeneXpert MRSA Assay (FDA approved for NASAL specimens only), is one component of a comprehensive MRSA colonization surveillance program. It is not intended to diagnose MRSA infection nor to guide or monitor treatment for MRSA infections. Performed at [facility], [address], [place], [place] [address]          Radiology Studies: Ct Abdomen Pelvis W Contrast  Result Date: 09/07/2017 CLINICAL DATA:  Gastroenteritis and diarrhea x3 weeks with fever. EXAM: CT ABDOMEN AND PELVIS WITH CONTRAST TECHNIQUE: Multidetector CT imaging of the abdomen and pelvis was performed using the standard protocol following bolus administration of intravenous contrast. CONTRAST:  129mL ISOVUE-300 IOPAMIDOL (ISOVUE-300)  INJECTION 61% COMPARISON:  None. FINDINGS: Lower chest: No acute abnormality. Hepatobiliary: Fatty infiltration along the falciform. No space-occupying mass of the liver. The gallbladder is physiologically distended without calculi. No biliary dilatation. Pancreas: Normal Spleen: Normal Adrenals/Urinary Tract: Bilateral adrenal nodules measuring 2.2 x 1.3 cm on the right and 0.8 x 0.7 cm on the left. Symmetric cortical enhancement of both kidneys. No nephrolithiasis nor obstructive uropathy. No hydroureteronephrosis. The urinary bladder is unremarkable for the degree of distention. Stomach/Bowel: Physiologically distended stomach. The duodenal sweep and ligament of Treitz are normal. No significant small bowel dilatation or inflammation. There is diffuse transmural thickening of the colon consistent with pancolitis. No bowel perforation or obstruction. What appears to be appendix is slightly fluid-filled and likely sympathetic secondary to the underlying colitis. Vascular/Lymphatic: Moderate aortoiliac atherosclerosis. No lymphadenopathy by CT size criteria.  Small retroperitoneal lymph nodes and retroperitoneal fatty induration is identified, nonspecific. Reproductive: Hysterectomy.  No adnexal mass. Other: No free air.  No free fluid. Musculoskeletal: No acute or significant osseous findings. IMPRESSION: Diffuse pancolitis is identified. No bowel perforation or obstruction. Bilateral nonspecific adrenal nodules measuring 2.2 x 1.3 cm on the right and 0.8 x 0.7 cm on left. Electronically Signed   By: Ashley Royalty M.D.   On: 09/07/2017 21:40        Scheduled Meds: Continuous Infusions: . sodium chloride 125 mL/hr at 09/08/17 0800  . 0.9 % NaCl with KCl 20 mEq / L 125 mL/hr at 09/08/17 0800  . ciprofloxacin 400 mg (09/08/17 1051)  . famotidine (PEPCID) IV 20 mg (09/08/17 1051)  . metronidazole Stopped (09/08/17 0731)     LOS: 0 days    Time spent: 30 minutes    Elyshia Kumagai Darleen Crocker, DO [facility] Pager [phone]  If 7PM-7AM, please contact night-coverage [email or web address] Password [email or web address] 09/08/2017, 11:27 AM

## 2017-09-08 NOTE — ED Notes (Signed)
Report given to ICU nurse.

## 2017-09-08 NOTE — Progress Notes (Signed)
Patient arrived to room 332. Patient ambulates to bathroom on her own. Telemetry is connected and IV is infusing. Patient offers no complaints at this time. Will continue to monitor.

## 2017-09-08 NOTE — Consult Note (Addendum)
Referring Provider: [facility] Primary Care Physician:  Loman Brooklyn, FNP Primary Gastroenterologist:  Dr. Gala Romney (previously unassigned)  Date of Admission: 09/07/17 Date of Consultation: 09/08/17  Reason for Consultation: Abdominal pain, diarrhea, anemia, pancolitis  HPI:  Christina Richardson is a 52 y.o. female with a past medical history of gastroenteritis, GERD, hyperlipidemia.  Emergency department physician and hospitalist notes reviewed.  Per these notes patient presented with complaints of exacerbation of chronic diarrhea, frequent nausea, and a single episode of emesis.  She notes a 2-year history of intermittent diarrhea with primary care work-up including C. difficile and stool studies which were negative.  Scheduled to see [facility] in 10 days.  4 days ago her symptoms worsened.  One episode of emesis 4 days ago.  Diarrhea has gotten progressively worse and having 8 bowel movements a day and frequently has bright red blood per rectum.  Diarrhea associated with intense abdominal pain which improves after a bowel movement.  Denied fever and chills.  She does have a history of GERD which is controlled on omeprazole.  She was likely dehydrated on presentation as her heart rate was 125.  She received 1/2 L of bolus fluids and improved somewhat until she had a flare of her abdominal pain and nausea.  Her hemoglobin on presentation was 10.7 and white count 7.6.  Her potassium was low at 2.8 likely from frequent diarrhea.  Kidney function normal at this time.  CT of the abdomen and pelvis with physiologically distended stomach and diffuse transmural thickening of the colon consistent with pancolitis without perforation or obstruction.  Given her ongoing need for fluids and difficulty tolerating oral intake she was admitted for further treatment.  She was made n.p.o. and started on IV fluids, antiemetics, analgesics, Cipro and Flagyl.  Her lipase was normal, CBC shows decreasing hemoglobin  this morning to 9.1 likely hydration effect in the setting of patient complains of bright red blood per rectum.  Her baseline for years ago was 12.0-13.3.  Today she confirms the above history. Chronic intermittent diarrhea over the past 2 years. In the past couple weeks noted worsening of diarrhea which became bloody and associated with nausea. This represents an acute exacerbation of her chronic symptoms. Has not been evaluated by GI before and had an appointment for colonoscopy and office visit in the next few weeks in [place] (she states she will cancel this as she lives in [place] and prefers to stay local). Has abdominal pain in the generalized abdomen when she needs to have a bowel movement and this will generally improve after a diarrhea stool. Has persistent nausea, no further vomiting. When her stools became bloody, it started with dark/tarry stools then progressed to bright red blood with no further dark stools since. She did have a slight fever at onset of acute symptoms and generally "didn't feel well." Has a history of GERD and is on PPI at home, no acute flares of her GERD symptoms currently. Stool for CDiff and O&P completed at [facility] 08/16/17 and both were negative. No other GI complaints.  Today she feels somewhat better, still having diarrhea and some blood noted. Her abdominal pain is persistent but improved. Currently on Cipro/Flagyl and received a single dose of IV Solumedrol 150 mg in the ER.  She denies family history of CRC, UC, Crohn's. Her mother had IBS and frequent postprandial diarrhea.  Past Medical History:  Diagnosis Date  . Gastroenteritis   . GERD (gastroesophageal reflux disease)   . Mixed  hyperlipidemia   . Seasonal allergies     Past Surgical History:  Procedure Laterality Date  . ABDOMINAL HYSTERECTOMY     With oopherectomy    Prior to Admission medications   Medication Sig Start Date End Date Taking? Authorizing Provider  atorvastatin (LIPITOR) 10  MG tablet Take 10 mg by mouth daily.  08/12/17  Yes [provider]  colestipol (COLESTID) 1 g tablet Take 1 g by mouth 2 (two) times daily.  08/29/17  Yes [provider]  dicyclomine (BENTYL) 10 MG capsule Take 10 mg by mouth 2 (two) times daily as needed for spasms.  08/15/17  Yes [provider]  hydrocortisone (ANUSOL-HC) 2.5 % rectal cream Place 1 application rectally daily as needed for hemorrhoids or anal itching.    Yes [provider]  hyoscyamine (LEVSIN SL) 0.125 MG SL tablet Take 0.125 mg by mouth 2 (two) times daily as needed for cramping.  08/29/17  Yes [provider]  omeprazole (PRILOSEC) 40 MG capsule Take 1 capsule (40 mg total) by mouth daily. 06/07/13  Yes Viyuoh, Adeline C, MD  ondansetron (ZOFRAN) 4 MG tablet Take 4 mg by mouth every 8 (eight) hours as needed for nausea or vomiting.  08/15/17  Yes [provider]  SF 5000 PLUS 1.1 % CREA dental cream Place 1 application onto teeth See admin instructions. Brush two to three times daily-Spit out excess 06/01/17  Yes [provider]  PLENVU 140 g SOLR Take by mouth See admin instructions.  08/30/17   [provider]  PREMARIN 0.625 MG tablet Take 0.625 mg by mouth daily. 08/27/17   [provider]    Current Facility-Administered Medications  Medication Dose Route Frequency Provider Last Rate Last Dose  . 0.9 %  sodium chloride infusion   Intravenous Continuous Reubin Milan, MD 125 mL/hr at 09/07/17 1656    . 0.9 % NaCl with KCl 20 mEq/ L  infusion   Intravenous Continuous Reubin Milan, MD 125 mL/hr at 09/08/17 [phone]    . acetaminophen (TYLENOL) tablet 650 mg  650 mg Oral Q6H PRN Reubin Milan, MD       Or  . acetaminophen (TYLENOL) suppository 650 mg  650 mg Rectal Q6H PRN Reubin Milan, MD      . ciprofloxacin (CIPRO) IVPB 400 mg  400 mg Intravenous Q12H Reubin Milan, MD      . famotidine (PEPCID) IVPB 20 mg premix  20 mg  Intravenous Q12H Reubin Milan, MD   Stopped at 09/08/17 0012  . fentaNYL (SUBLIMAZE) injection 50 mcg  50 mcg Intravenous Q2H PRN Reubin Milan, MD      . metroNIDAZOLE (FLAGYL) IVPB 500 mg  500 mg Intravenous Q8H Reubin Milan, MD 100 mL/hr at 09/08/17 0631 500 mg at 09/08/17 0631  . promethazine (PHENERGAN) injection 25 mg  25 mg Intravenous Q6H PRN Reubin Milan, MD        Allergies as of 09/07/2017 - Review Complete 09/07/2017  Allergen Reaction Noted  . Nicotine Rash and Other (See Comments) 04/13/2017    Family History  Problem Relation Age of Onset  . Heart attack Mother   . Stroke Mother   . Diabetes Mother   . Stroke Maternal Grandmother   . Diabetes Maternal Grandmother   . Congestive Heart Failure Maternal Grandmother   . Lung disease Sister   . Stroke Maternal Uncle     Social History   Socioeconomic History  .  Marital status: Married    Spouse name: Not on file  . Number of children: Not on file  . Years of education: Not on file  . Highest education level: Not on file  Occupational History  . Occupation: Marine scientist: [employer]    Comment: [employer] in [place]  . Financial resource strain: Not on file  . Food insecurity:    Worry: Not on file    Inability: Not on file  . Transportation needs:    Medical: Not on file    Non-medical: Not on file  Tobacco Use  . Smoking status: Former Smoker    Years: 30.00    Types: Cigarettes    Last attempt to quit: 04/09/2014    Years since quitting: 3.4  . Smokeless tobacco: Never Used  Substance and Sexual Activity  . Alcohol use: No  . Drug use: No  . Sexual activity: Yes  Lifestyle  . Physical activity:    Days per week: Not on file    Minutes per session: Not on file  . Stress: Not on file  Relationships  . Social connections:    Talks on phone: Not on file    Gets together: Not on file    Attends religious service: Not on file    Active member of  club or organization: Not on file    Attends meetings of clubs or organizations: Not on file    Relationship status: Not on file  . Intimate partner violence:    Fear of current or ex partner: Not on file    Emotionally abused: Not on file    Physically abused: Not on file    Forced sexual activity: Not on file  Other Topics Concern  . Not on file  Social History Narrative  . Not on file    Review of Systems: General: Negative for anorexia, weight loss, fever, chills. ENT: Negative for hoarseness, difficulty swallowing , nasal congestion. CV: Negative for chest pain, angina, palpitations, peripheral edema.  Respiratory: Negative for dyspnea at rest, cough, sputum, wheezing.  GI: See history of present illness. Derm: Negative for rash or itching.  Endo: Negative for unusual weight change.  Heme: Negative for bruising or bleeding. Allergy: Negative for rash or hives.  Physical Exam: Vital signs in last 24 hours: Temp:  [98.1 F (36.7 C)-98.3 F (36.8 C)] 98.2 F (36.8 C) (08/30 0400) Pulse Rate:  [86-125] 86 (08/30 0600) Resp:  [14-32] 20 (08/30 0600) BP: (77-116)/(43-71) 97/63 (08/30 0600) SpO2:  [86 %-100 %] 92 % (08/30 0600) FiO2 (%):  [21 %] 21 % (08/29 2227) Weight:  [71.5 kg] 71.5 kg (08/30 0500) Last BM Date: 09/07/17 General:   Alert,  Well-developed, well-nourished, pleasant and cooperative in NAD Eyes:  Sclera clear, no icterus. Conjunctiva pink. Ears:  Normal auditory acuity. Nose:  No deformity, discharge, or lesions. Lungs:  Clear throughout to auscultation.   No wheezes, crackles, or rhonchi. No acute distress. Heart:  Regular rate and rhythm; no murmurs, clicks, rubs,  or gallops. Abdomen:  Soft, and nondistended. Mild to moderate TTP noted generalized abdomen. No masses, hepatosplenomegaly or hernias noted. Normal bowel sounds, without guarding, and without rebound.   Rectal:  Deferred.   Msk:  Symmetrical without gross deformities. Pulses:  Normal  bilateral DP pulses noted. Extremities:  Without clubbing or edema. Neurologic:  Alert and  oriented x4;  grossly normal neurologically. Psych:  Alert and cooperative. Normal mood and affect.  Intake/Output  from previous day: 08/29 0701 - 08/30 0700 In: 4371.7 [P.O.:360; I.V.:2525; IV Piggyback:1486.7] Out: -  Intake/Output this shift: No intake/output data recorded.  Lab Results: Recent Labs    09/07/17 1651 09/08/17 0353  WBC 7.6 6.9  HGB 10.7* 9.1*  HCT 31.4* 27.4*  PLT 267 233   BMET Recent Labs    09/07/17 1651 09/08/17 0353  NA 134* 139  K 2.8* 3.4*  CL 100 109  CO2 24 22  GLUCOSE 116* 172*  BUN 11 6  CREATININE 0.75 0.66  CALCIUM 8.1* 7.3*   LFT Recent Labs    09/07/17 1651 09/08/17 0353  PROT 6.7 5.7*  ALBUMIN 3.0* 2.5*  AST 13* 10*  ALT 11 12  ALKPHOS 87 70  BILITOT 1.3* 0.9   PT/INR No results for input(s): LABPROT, INR in the last 72 hours. Hepatitis Panel No results for input(s): HEPBSAG, HCVAB, HEPAIGM, HEPBIGM in the last 72 hours. C-Diff No results for input(s): CDIFFTOX in the last 72 hours.  Studies/Results: Ct Abdomen Pelvis W Contrast  Result Date: 09/07/2017 CLINICAL DATA:  Gastroenteritis and diarrhea x3 weeks with fever. EXAM: CT ABDOMEN AND PELVIS WITH CONTRAST TECHNIQUE: Multidetector CT imaging of the abdomen and pelvis was performed using the standard protocol following bolus administration of intravenous contrast. CONTRAST:  163mL ISOVUE-300 IOPAMIDOL (ISOVUE-300) INJECTION 61% COMPARISON:  None. FINDINGS: Lower chest: No acute abnormality. Hepatobiliary: Fatty infiltration along the falciform. No space-occupying mass of the liver. The gallbladder is physiologically distended without calculi. No biliary dilatation. Pancreas: Normal Spleen: Normal Adrenals/Urinary Tract: Bilateral adrenal nodules measuring 2.2 x 1.3 cm on the right and 0.8 x 0.7 cm on the left. Symmetric cortical enhancement of both kidneys. No nephrolithiasis nor  obstructive uropathy. No hydroureteronephrosis. The urinary bladder is unremarkable for the degree of distention. Stomach/Bowel: Physiologically distended stomach. The duodenal sweep and ligament of Treitz are normal. No significant small bowel dilatation or inflammation. There is diffuse transmural thickening of the colon consistent with pancolitis. No bowel perforation or obstruction. What appears to be appendix is slightly fluid-filled and likely sympathetic secondary to the underlying colitis. Vascular/Lymphatic: Moderate aortoiliac atherosclerosis. No lymphadenopathy by CT size criteria. Small retroperitoneal lymph nodes and retroperitoneal fatty induration is identified, nonspecific. Reproductive: Hysterectomy.  No adnexal mass. Other: No free air.  No free fluid. Musculoskeletal: No acute or significant osseous findings. IMPRESSION: Diffuse pancolitis is identified. No bowel perforation or obstruction. Bilateral nonspecific adrenal nodules measuring 2.2 x 1.3 cm on the right and 0.8 x 0.7 cm on left. Electronically Signed   By: Ashley Royalty M.D.   On: 09/07/2017 21:40    Impression: Very pleasant 52 year old female with a 2-year history of waxing and waning chronic diarrhea.  She is now suffering an acute exacerbation of her chronic symptoms in addition to initial melena which evolved into bright red blood per rectum with every bowel movement.  She is having multiple bowel movements a day.  She has associated abdominal pain which improves somewhat after having a loose bowel movement.  Chronic GERD with no acute exacerbation of the symptoms.  CT of her abdomen shows diffuse pancolitis which was initially treated as infectious colitis.  However, she had stool studies for C. difficile and ova/parasite approximately 12 days ago which was negative.  Given the progression of her symptoms, acute onset with recent onset of chronic symptoms we must consider inflammatory bowel disease, more likely ulcerative colitis  over Crohn's.  We will plan to treat her with IV steroids and watch  for clinical progression.  If she does respond we can likely plan for steroid management after discharge with outpatient colonoscopy for evaluation.  If she does not improve on steroids she may need to flex will sigmoidoscopy for biopsies for more definitive diagnosis.  Her vital signs are stable at this time.  Her hemoglobin has she testing significant blood loss with her symptoms.  This will need to be monitored and treated accordingly.  Plan: 1. Discontinue Bentyl and Colestid 2. IV Solumedrol 20 mg every 8 hours 3. Can continue antibiotics for now 4. Will reassess over the weekend. If no improvement may need flex sig for biopsies 5. Monitor for further GI bleed 6. Follow H/H 7. Transfuse as necessary 8. Change famotidine to Protonix 9. Supportive measures   Thank you for allowing [place] to participate in the care of Christina [place], DNP, AGNP-C Adult & Gerontological Nurse Practitioner [facility]    LOS: 0 days     09/08/2017, 8:23 AM

## 2017-09-09 DIAGNOSIS — K5100 Ulcerative (chronic) pancolitis without complications: Principal | ICD-10-CM

## 2017-09-09 LAB — COMPREHENSIVE METABOLIC PANEL
ALBUMIN: 2.5 g/dL — AB (ref 3.5–5.0)
ALT: 11 U/L (ref 0–44)
AST: 13 U/L — AB (ref 15–41)
Alkaline Phosphatase: 61 U/L (ref 38–126)
Anion gap: 9 (ref 5–15)
BUN: 8 mg/dL (ref 6–20)
CHLORIDE: 107 mmol/L (ref 98–111)
CO2: 24 mmol/L (ref 22–32)
CREATININE: 0.54 mg/dL (ref 0.44–1.00)
Calcium: 7.9 mg/dL — ABNORMAL LOW (ref 8.9–10.3)
GFR calc Af Amer: >60 mL/min (ref >60)
GFR calc non Af Amer: >60 mL/min (ref >60)
GLUCOSE: 141 mg/dL — AB (ref 70–99)
Potassium: 3.5 mmol/L (ref 3.5–5.1)
SODIUM: 140 mmol/L (ref 135–145)
Total Bilirubin: 0.4 mg/dL (ref 0.3–1.2)
Total Protein: 5.6 g/dL — ABNORMAL LOW (ref 6.5–8.1)

## 2017-09-09 LAB — CBC
HCT: 27 % — ABNORMAL LOW (ref 36.0–46.0)
HEMOGLOBIN: 9 g/dL — AB (ref 12.0–15.0)
MCH: 31 pg (ref 26.0–34.0)
MCHC: 33.3 g/dL (ref 30.0–36.0)
MCV: 93.1 fL (ref 78.0–100.0)
PLATELETS: 276 10*3/uL (ref 150–400)
RBC: 2.9 MIL/uL — ABNORMAL LOW (ref 3.87–5.11)
RDW: 13.9 % (ref 11.5–15.5)
WBC: 7.7 10*3/uL (ref 4.0–10.5)

## 2017-09-09 MED ORDER — ALUM & MAG HYDROXIDE-SIMETH 200-200-20 MG/5ML PO SUSP
30.0000 mL | ORAL | Status: DC | PRN
  Administered 2017-09-09: 30 mL via ORAL
  Filled 2017-09-09: qty 30

## 2017-09-09 MED ORDER — METHYLPREDNISOLONE SODIUM SUCC 40 MG IJ SOLR
30.0000 mg | Freq: Three times a day (TID) | INTRAMUSCULAR | Status: DC
  Administered 2017-09-09 – 2017-09-11 (×7): 30 mg via INTRAVENOUS
  Filled 2017-09-09 (×7): qty 1

## 2017-09-09 MED ORDER — POTASSIUM CHLORIDE IN NACL 20-0.45 MEQ/L-% IV SOLN
INTRAVENOUS | Status: DC
  Administered 2017-09-09 – 2017-09-11 (×3): via INTRAVENOUS
  Filled 2017-09-09 (×6): qty 1000

## 2017-09-09 NOTE — Progress Notes (Addendum)
Subjective:  She reports 6 bowel movements since 10:00 last night.  All watery with bright red blood per rectum.  No improvement in frequency or consistency.  No improvement in abdominal pain.  Although admits that symptoms are not worse.  Objective: Vital signs in last 24 hours: Temp:  [97.4 F (36.3 C)-98 F (36.7 C)] 98 F (36.7 C) (08/31 0531) Pulse Rate:  [79-87] 79 (08/31 0531) Resp:  [16-17] 16 (08/31 0531) BP: (97-105)/(54-62) 105/62 (08/31 0531) SpO2:  [96 %-97 %] 96 % (08/31 0531) Last BM Date: 09/09/17 General:   Alert,  Well-developed, well-nourished, pleasant and cooperative in NAD Head:  Normocephalic and atraumatic. Eyes:  Sclera clear, no icterus. . Abdomen:  Soft, mild to moderate diffuse tenderness, nondistended.Normal bowel sounds, without guarding, and without rebound.   Extremities:  Without clubbing, deformity or edema. Neurologic:  Alert and  oriented x4;  grossly normal neurologically. Skin:  Intact without significant lesions or rashes. Psych:  Alert and cooperative. Normal mood and affect.  Intake/Output from previous day: 08/30 0701 - 08/31 0700 In: 2645 [P.O.:360; I.V.:2028.3; IV Piggyback:256.7] Out: 300 [Urine:300] Intake/Output this shift: No intake/output data recorded.  Lab Results: CBC Recent Labs    09/07/17 1651 09/08/17 0353 09/09/17 0705  WBC 7.6 6.9 7.7  HGB 10.7* 9.1* 9.0*  HCT 31.4* 27.4* 27.0*  MCV 91.3 92.3 93.1  PLT 267 233 276   BMET Recent Labs    09/07/17 1651 09/08/17 0353 09/09/17 0705  NA 134* 139 140  K 2.8* 3.4* 3.5  CL 100 109 107  CO2 24 22 24   GLUCOSE 116* 172* 141*  BUN 11 6 8   CREATININE 0.75 0.66 0.54  CALCIUM 8.1* 7.3* 7.9*   LFTs Recent Labs    09/07/17 1651 09/08/17 0353 09/09/17 0705  BILITOT 1.3* 0.9 0.4  ALKPHOS 87 70 61  AST 13* 10* 13*  ALT 11 12 11   PROT 6.7 5.7* 5.6*  ALBUMIN 3.0* 2.5* 2.5*   Recent Labs    09/07/17 1651  LIPASE 18   PT/INR No results for input(s):  LABPROT, INR in the last 72 hours.    Imaging Studies: Ct Abdomen Pelvis W Contrast  Result Date: 09/07/2017 CLINICAL DATA:  Gastroenteritis and diarrhea x3 weeks with fever. EXAM: CT ABDOMEN AND PELVIS WITH CONTRAST TECHNIQUE: Multidetector CT imaging of the abdomen and pelvis was performed using the standard protocol following bolus administration of intravenous contrast. CONTRAST:  116mL ISOVUE-300 IOPAMIDOL (ISOVUE-300) INJECTION 61% COMPARISON:  None. FINDINGS: Lower chest: No acute abnormality. Hepatobiliary: Fatty infiltration along the falciform. No space-occupying mass of the liver. The gallbladder is physiologically distended without calculi. No biliary dilatation. Pancreas: Normal Spleen: Normal Adrenals/Urinary Tract: Bilateral adrenal nodules measuring 2.2 x 1.3 cm on the right and 0.8 x 0.7 cm on the left. Symmetric cortical enhancement of both kidneys. No nephrolithiasis nor obstructive uropathy. No hydroureteronephrosis. The urinary bladder is unremarkable for the degree of distention. Stomach/Bowel: Physiologically distended stomach. The duodenal sweep and ligament of Treitz are normal. No significant small bowel dilatation or inflammation. There is diffuse transmural thickening of the colon consistent with pancolitis. No bowel perforation or obstruction. What appears to be appendix is slightly fluid-filled and likely sympathetic secondary to the underlying colitis. Vascular/Lymphatic: Moderate aortoiliac atherosclerosis. No lymphadenopathy by CT size criteria. Small retroperitoneal lymph nodes and retroperitoneal fatty induration is identified, nonspecific. Reproductive: Hysterectomy.  No adnexal mass. Other: No free air.  No free fluid. Musculoskeletal: No acute or significant osseous findings. IMPRESSION: Diffuse pancolitis is  identified. No bowel perforation or obstruction. Bilateral nonspecific adrenal nodules measuring 2.2 x 1.3 cm on the right and 0.8 x 0.7 cm on left. Electronically  Signed   By: Ashley Royalty M.D.   On: 09/07/2017 21:40  [2 weeks]   Assessment:  52 year old female with 2-year history of intermittent chronic diarrhea dealing with acute exacerbation over the past 2 months.  Initially with melena, evolving into bright red blood per rectum with bowel movements.  Numerous stools daily associated with significant abdominal cramping.  Had been on Colestid, dicyclomine as an outpatient for possible IBS with no relief. Stool studies were negative for C. difficile and ova and parasites as an outpatient.  As an inpatient, CT showed pancolitis.  It is felt that she likely has inflammatory bowel disease, IV steroids initiated 2 days ago.  Symptoms recently likely exacerbated by high-dose NSAIDs.  Bilateral nonspecific adrenal nodules  Fatty infiltration of liver on CT  Plan: 1. Continue scheduled IV Solu-Medrol. Will increase dosage to 30mg  iv tid.  2. Hold resin binders and antimotility agents. 3. There are flexible sigmoidoscopy with biopsy if no clinical improvement over the next 48 hours or so. 4. Continue clear liquid diet.  Laureen Ochs. Bernarda Caffey [facility] [phone] 8/31/201910:41 AM     LOS: 0 days

## 2017-09-09 NOTE — Progress Notes (Signed)
PROGRESS NOTE    Christina Richardson  SHF:[id number] DOB: [birth year] DOA: 09/07/2017 PCP: Loman Brooklyn, FNP   Brief Narrative:   Christina Richardson a 52 y.o.femalewith medical history significant of gastroenteritis, GERD, mixed hyperlipidemia, seasonal allergies who is coming with complaints of exacerbation of chronic diarrhea, frequent nausea and one episode of emesis Monday.  She is noted to have frequent, intermittent episodes of diarrhea over the past 2 years with negative outpatient work-up noted thus far.  She was noted to have pancolitis on abdominal CT for which she has been started on IV ciprofloxacin and Flagyl.  She has been seen by GI with concern for new onset IBD with ulcerative colitis for which she has been started on IV steroids with plans for flex sigmoidoscopy as needed in 48 hours.  Assessment & Plan:   Principal Problem:   Pancolitis ([place]) Active Problems:   GERD (gastroesophageal reflux disease)   Mixed hyperlipidemia   Anemia   Hypokalemia   Hyperbilirubinemia  1. Pancolitis-ongoing.  Associated with likely new onset IBD with ulcerative colitis.  Appreciate GI recommendations with scheduled IV steroids to be increased to 30mg  TID today.  Continue on IV fluids as well as antiemetics as well as ciprofloxacin and Flagyl IV.  Maintain on clear liquid diet for now. 2. GERD.  Continue Protonix. 3. Dyslipidemia.  Continue statin. 4. Anemia.  Follow-up CBC in a.m. with anemia panel pending. 5. Hypokalemia-resolved.  Undergoing IV replacement as pt is borderline low.  Labs in am to recheck. 6. Hyperbilirubinemia.  Continue IV hydration and monitor CMP in a.m.    DVT prophylaxis:SCDs Code Status: Full Family Communication: None at bedside Disposition Plan: IV hydration and abx treatment   Consultants:   GI  Procedures:   None  Antimicrobials:   Cipro and Flagyl 8/29->   Subjective: Patient seen and evaluated today and continues to have  ongoing loose liquidy stools.  She has apparently had approximately 6 bowel movements since 10 PM last night and her last bowel movement this morning had a significant amount of bright red blood per rectum.  Symptoms apparently remain the same this morning.  Objective: Vitals:   09/08/17 1213 09/08/17 1549 09/08/17 2056 09/09/17 0531  BP:   (!) 97/54 105/62  Pulse:   85 79  Resp:   16 16  Temp: 97.7 F (36.5 C) 97.7 F (36.5 C) (!) 97.4 F (36.3 C) 98 F (36.7 C)  TempSrc: Oral Oral Oral Oral  SpO2:   97% 96%  Weight:      Height:        Intake/Output Summary (Last 24 hours) at 09/09/2017 1253 Last data filed at 09/09/2017 0800 Gross per 24 hour  Intake 1395 ml  Output 300 ml  Net 1095 ml   Filed Weights   09/08/17 0053 09/08/17 0500  Weight: 71.5 kg 71.5 kg    Examination:  General exam: Appears calm and comfortable  Respiratory system: Clear to auscultation. Respiratory effort normal. Cardiovascular system: S1 & S2 heard, RRR. No JVD, murmurs, rubs, gallops or clicks. No pedal edema. Gastrointestinal system: Abdomen is nondistended, soft and nontender. No organomegaly or masses felt. Normal bowel sounds heard. Central nervous system: Alert and oriented. No focal neurological deficits. Extremities: Symmetric 5 x 5 power. Skin: No rashes, lesions or ulcers Psychiatry: Judgement and insight appear normal. Mood & affect appropriate.     Data Reviewed: I have personally reviewed following labs and imaging studies  CBC: Recent Labs  Lab 09/07/17 1651 09/08/17 0353  09/09/17 0705  WBC 7.6 6.9 7.7  NEUTROABS 5.7 6.2  --   HGB 10.7* 9.1* 9.0*  HCT 31.4* 27.4* 27.0*  MCV 91.3 92.3 93.1  PLT 267 233 562   Basic Metabolic Panel: Recent Labs  Lab 09/07/17 1635 09/07/17 1651 09/08/17 0353 09/09/17 0705  NA  --  134* 139 140  K  --  2.8* 3.4* 3.5  CL  --  100 109 107  CO2  --  24 22 24   GLUCOSE  --  116* 172* 141*  BUN  --  11 6 8   CREATININE  --  0.75 0.66  0.54  CALCIUM  --  8.1* 7.3* 7.9*  MG 1.7  --   --   --   PHOS 2.8  --   --   --    GFR: Estimated Creatinine Clearance: 88.9 mL/min (by C-G formula based on SCr of 0.54 mg/dL). Liver Function Tests: Recent Labs  Lab 09/07/17 1651 09/08/17 0353 09/09/17 0705  AST 13* 10* 13*  ALT 11 12 11   ALKPHOS 87 70 61  BILITOT 1.3* 0.9 0.4  PROT 6.7 5.7* 5.6*  ALBUMIN 3.0* 2.5* 2.5*   Recent Labs  Lab 09/07/17 1651  LIPASE 18   No results for input(s): AMMONIA in the last 168 hours. Coagulation Profile: No results for input(s): INR, PROTIME in the last 168 hours. Cardiac Enzymes: No results for input(s): CKTOTAL, CKMB, CKMBINDEX, TROPONINI in the last 168 hours. BNP (last 3 results) No results for input(s): PROBNP in the last 8760 hours. HbA1C: No results for input(s): HGBA1C in the last 72 hours. CBG: No results for input(s): GLUCAP in the last 168 hours. Lipid Profile: No results for input(s): CHOL, HDL, LDLCALC, TRIG, CHOLHDL, LDLDIRECT in the last 72 hours. Thyroid Function Tests: No results for input(s): TSH, T4TOTAL, FREET4, T3FREE, THYROIDAB in the last 72 hours. Anemia Panel: No results for input(s): VITAMINB12, FOLATE, FERRITIN, TIBC, IRON, RETICCTPCT in the last 72 hours. Sepsis Labs: No results for input(s): PROCALCITON, LATICACIDVEN in the last 168 hours.  Recent Results (from the past 240 hour(s))  MRSA PCR Screening     Status: None   Collection Time: 09/08/17 12:45 AM  Result Value Ref Range Status   MRSA by PCR NEGATIVE NEGATIVE Final    Comment:        The GeneXpert MRSA Assay (FDA approved for NASAL specimens only), is one component of a comprehensive MRSA colonization surveillance program. It is not intended to diagnose MRSA infection nor to guide or monitor treatment for MRSA infections. Performed at [facility], [address] [place], [place] [address]          Radiology Studies: Ct Abdomen Pelvis W Contrast  Result Date:  09/07/2017 CLINICAL DATA:  Gastroenteritis and diarrhea x3 weeks with fever. EXAM: CT ABDOMEN AND PELVIS WITH CONTRAST TECHNIQUE: Multidetector CT imaging of the abdomen and pelvis was performed using the standard protocol following bolus administration of intravenous contrast. CONTRAST:  193mL ISOVUE-300 IOPAMIDOL (ISOVUE-300) INJECTION 61% COMPARISON:  None. FINDINGS: Lower chest: No acute abnormality. Hepatobiliary: Fatty infiltration along the falciform. No space-occupying mass of the liver. The gallbladder is physiologically distended without calculi. No biliary dilatation. Pancreas: Normal Spleen: Normal Adrenals/Urinary Tract: Bilateral adrenal nodules measuring 2.2 x 1.3 cm on the right and 0.8 x 0.7 cm on the left. Symmetric cortical enhancement of both kidneys. No nephrolithiasis nor obstructive uropathy. No hydroureteronephrosis. The urinary bladder is unremarkable for the degree of distention. Stomach/Bowel: Physiologically distended stomach. The  duodenal sweep and ligament of Treitz are normal. No significant small bowel dilatation or inflammation. There is diffuse transmural thickening of the colon consistent with pancolitis. No bowel perforation or obstruction. What appears to be appendix is slightly fluid-filled and likely sympathetic secondary to the underlying colitis. Vascular/Lymphatic: Moderate aortoiliac atherosclerosis. No lymphadenopathy by CT size criteria. Small retroperitoneal lymph nodes and retroperitoneal fatty induration is identified, nonspecific. Reproductive: Hysterectomy.  No adnexal mass. Other: No free air.  No free fluid. Musculoskeletal: No acute or significant osseous findings. IMPRESSION: Diffuse pancolitis is identified. No bowel perforation or obstruction. Bilateral nonspecific adrenal nodules measuring 2.2 x 1.3 cm on the right and 0.8 x 0.7 cm on left. Electronically Signed   By: Ashley Royalty M.D.   On: 09/07/2017 21:40        Scheduled Meds: . atorvastatin  10 mg  Oral Daily  . methylPREDNISolone (SOLU-MEDROL) injection  30 mg Intravenous Q8H  . pantoprazole  40 mg Oral Daily   Continuous Infusions: . 0.45 % NaCl with KCl 20 mEq / L 75 mL/hr at 09/09/17 1218  . ciprofloxacin Stopped (09/09/17 1132)  . metronidazole 500 mg (09/09/17 0556)     LOS: 0 days    Time spent: 30 minutes    Neal Oshea Darleen Crocker, DO [facility] Pager [phone]  If 7PM-7AM, please contact night-coverage [email or web address] Password [facility] 09/09/2017, 12:53 PM

## 2017-09-10 DIAGNOSIS — K529 Noninfective gastroenteritis and colitis, unspecified: Secondary | ICD-10-CM | POA: Diagnosis present

## 2017-09-10 DIAGNOSIS — Z79890 Hormone replacement therapy (postmenopausal): Secondary | ICD-10-CM | POA: Diagnosis not present

## 2017-09-10 DIAGNOSIS — F17210 Nicotine dependence, cigarettes, uncomplicated: Secondary | ICD-10-CM | POA: Diagnosis present

## 2017-09-10 DIAGNOSIS — E876 Hypokalemia: Secondary | ICD-10-CM | POA: Diagnosis present

## 2017-09-10 DIAGNOSIS — D649 Anemia, unspecified: Secondary | ICD-10-CM | POA: Diagnosis present

## 2017-09-10 DIAGNOSIS — K760 Fatty (change of) liver, not elsewhere classified: Secondary | ICD-10-CM | POA: Diagnosis present

## 2017-09-10 DIAGNOSIS — K589 Irritable bowel syndrome without diarrhea: Secondary | ICD-10-CM | POA: Diagnosis present

## 2017-09-10 DIAGNOSIS — K219 Gastro-esophageal reflux disease without esophagitis: Secondary | ICD-10-CM | POA: Diagnosis present

## 2017-09-10 DIAGNOSIS — K5100 Ulcerative (chronic) pancolitis without complications: Secondary | ICD-10-CM | POA: Diagnosis present

## 2017-09-10 DIAGNOSIS — Z888 Allergy status to other drugs, medicaments and biological substances status: Secondary | ICD-10-CM | POA: Diagnosis not present

## 2017-09-10 DIAGNOSIS — Z79899 Other long term (current) drug therapy: Secondary | ICD-10-CM | POA: Diagnosis not present

## 2017-09-10 DIAGNOSIS — R51 Headache: Secondary | ICD-10-CM | POA: Diagnosis present

## 2017-09-10 DIAGNOSIS — R197 Diarrhea, unspecified: Secondary | ICD-10-CM | POA: Diagnosis present

## 2017-09-10 DIAGNOSIS — E782 Mixed hyperlipidemia: Secondary | ICD-10-CM | POA: Diagnosis present

## 2017-09-10 LAB — COMPREHENSIVE METABOLIC PANEL
ALK PHOS: 55 U/L (ref 38–126)
ALT: 11 U/L (ref 0–44)
AST: 10 U/L — AB (ref 15–41)
Albumin: 2.5 g/dL — ABNORMAL LOW (ref 3.5–5.0)
Anion gap: 7 (ref 5–15)
BUN: 9 mg/dL (ref 6–20)
CALCIUM: 7.9 mg/dL — AB (ref 8.9–10.3)
CHLORIDE: 107 mmol/L (ref 98–111)
CO2: 26 mmol/L (ref 22–32)
CREATININE: 0.53 mg/dL (ref 0.44–1.00)
GFR calc non Af Amer: >60 mL/min (ref >60)
GLUCOSE: 119 mg/dL — AB (ref 70–99)
Potassium: 3.9 mmol/L (ref 3.5–5.1)
SODIUM: 140 mmol/L (ref 135–145)
Total Bilirubin: 0.4 mg/dL (ref 0.3–1.2)
Total Protein: 5.4 g/dL — ABNORMAL LOW (ref 6.5–8.1)

## 2017-09-10 LAB — CBC
HCT: 27.5 % — ABNORMAL LOW (ref 36.0–46.0)
Hemoglobin: 9 g/dL — ABNORMAL LOW (ref 12.0–15.0)
MCH: 30.5 pg (ref 26.0–34.0)
MCHC: 32.7 g/dL (ref 30.0–36.0)
MCV: 93.2 fL (ref 78.0–100.0)
PLATELETS: 303 10*3/uL (ref 150–400)
RBC: 2.95 MIL/uL — AB (ref 3.87–5.11)
RDW: 13.9 % (ref 11.5–15.5)
WBC: 6.4 10*3/uL (ref 4.0–10.5)

## 2017-09-10 NOTE — Progress Notes (Signed)
Subjective:  Abdominal pain has improved.  Still with watery stools with bright red blood per rectum, no significant improvement in frequency.  Tolerated eggs, bacon for breakfast.  Objective: Vital signs in last 24 hours: Temp:  [97.4 F (36.3 C)-98.1 F (36.7 C)] 98.1 F (36.7 C) (09/01 0528) Pulse Rate:  [73-78] 73 (09/01 0528) Resp:  [16-18] 16 (09/01 0528) BP: (111-115)/(65-71) 115/71 (09/01 0528) SpO2:  [98 %-99 %] 98 % (09/01 0528) Last BM Date: 09/09/17 General:   Alert,  Well-developed, well-nourished, pleasant and cooperative in NAD Head:  Normocephalic and atraumatic. Eyes:  Sclera clear, no icterus.  Abdomen:  Soft, mild diffuse tenderness, nondistended. Normal bowel sounds, without guarding, and without rebound.   Extremities:  Without clubbing, deformity or edema. Neurologic:  Alert and  oriented x4;  grossly normal neurologically. Skin:  Intact without significant lesions or rashes. Psych:  Alert and cooperative. Normal mood and affect.  Intake/Output from previous day: 08/31 0701 - 09/01 0700 In: 2346.7 [P.O.:1200; I.V.:180; IV Piggyback:966.7] Out: -  Intake/Output this shift: No intake/output data recorded.  Lab Results: CBC Recent Labs    09/08/17 0353 09/09/17 0705 09/10/17 0641  WBC 6.9 7.7 6.4  HGB 9.1* 9.0* 9.0*  HCT 27.4* 27.0* 27.5*  MCV 92.3 93.1 93.2  PLT 233 276 303   BMET Recent Labs    09/08/17 0353 09/09/17 0705 09/10/17 0641  NA 139 140 140  K 3.4* 3.5 3.9  CL 109 107 107  CO2 22 24 26   GLUCOSE 172* 141* 119*  BUN 6 8 9   CREATININE 0.66 0.54 0.53  CALCIUM 7.3* 7.9* 7.9*   LFTs Recent Labs    09/08/17 0353 09/09/17 0705 09/10/17 0641  BILITOT 0.9 0.4 0.4  ALKPHOS 70 61 55  AST 10* 13* 10*  ALT 12 11 11   PROT 5.7* 5.6* 5.4*  ALBUMIN 2.5* 2.5* 2.5*   Recent Labs    09/07/17 1651  LIPASE 18   PT/INR No results for input(s): LABPROT, INR in the last 72 hours.    Imaging Studies: Ct Abdomen Pelvis W  Contrast  Result Date: 09/07/2017 CLINICAL DATA:  Gastroenteritis and diarrhea x3 weeks with fever. EXAM: CT ABDOMEN AND PELVIS WITH CONTRAST TECHNIQUE: Multidetector CT imaging of the abdomen and pelvis was performed using the standard protocol following bolus administration of intravenous contrast. CONTRAST:  129mL ISOVUE-300 IOPAMIDOL (ISOVUE-300) INJECTION 61% COMPARISON:  None. FINDINGS: Lower chest: No acute abnormality. Hepatobiliary: Fatty infiltration along the falciform. No space-occupying mass of the liver. The gallbladder is physiologically distended without calculi. No biliary dilatation. Pancreas: Normal Spleen: Normal Adrenals/Urinary Tract: Bilateral adrenal nodules measuring 2.2 x 1.3 cm on the right and 0.8 x 0.7 cm on the left. Symmetric cortical enhancement of both kidneys. No nephrolithiasis nor obstructive uropathy. No hydroureteronephrosis. The urinary bladder is unremarkable for the degree of distention. Stomach/Bowel: Physiologically distended stomach. The duodenal sweep and ligament of Treitz are normal. No significant small bowel dilatation or inflammation. There is diffuse transmural thickening of the colon consistent with pancolitis. No bowel perforation or obstruction. What appears to be appendix is slightly fluid-filled and likely sympathetic secondary to the underlying colitis. Vascular/Lymphatic: Moderate aortoiliac atherosclerosis. No lymphadenopathy by CT size criteria. Small retroperitoneal lymph nodes and retroperitoneal fatty induration is identified, nonspecific. Reproductive: Hysterectomy.  No adnexal mass. Other: No free air.  No free fluid. Musculoskeletal: No acute or significant osseous findings. IMPRESSION: Diffuse pancolitis is identified. No bowel perforation or obstruction. Bilateral nonspecific adrenal nodules measuring 2.2 x 1.3  cm on the right and 0.8 x 0.7 cm on left. Electronically Signed   By: Ashley Royalty M.D.   On: 09/07/2017 21:40  [2  weeks]   Assessment: 52 year old female with 2-year history of intermittent chronic diarrhea dealing with acute exacerbation of the past 2 months in the setting of high-dose NSAIDs.  Initially with melena, evolving into bright red blood per rectum with bowel movements.  Associated abdominal cramping.  Prior outpatient stool studies negative for C. difficile and O&P.  Inpatient CT showed pancolitis.  Suspected to have inflammatory bowel disease.  IV steroids initiated, increased dose yesterday with symptomatic improvement of her abdominal pain and will hopefully see ongoing improvement over the next 24 hours.    Bilateral nonspecific adrenal nodules  Fatty infiltration of liver on CT   Plan: 1. Continue current IV Solu-Medrol dose 2. Avoid resin binders and antimotility agents 3. Reevaluate in the morning.  Make decision regarding flexible sigmoidoscopy with biopsy based on clinical improvement.  Laureen Ochs. Bernarda Caffey [facility] [phone] 9/1/201912:12 PM     LOS: 0 days

## 2017-09-10 NOTE — Progress Notes (Signed)
PROGRESS NOTE    Christina Richardson  YIR:[id number] DOB: 12/06/1965 DOA: 09/07/2017 PCP: Christina Brooklyn, FNP   Brief Narrative:   Christina Richardson a 52 y.o.femalewith medical history significant of gastroenteritis, GERD, mixed hyperlipidemia, seasonal allergies who is coming with complaints of exacerbation of chronic diarrhea, frequent nausea and one episode of emesis Monday.She is noted to have frequent, intermittent episodes of diarrhea over the past 2 years with negative outpatient work-up noted thus far. She was noted to have pancolitis on abdominal CT for which she has been started on IV ciprofloxacin and Flagyl. She has been seen by GI with concern for new onset IBD with ulcerative colitis for which she has been started on IV steroids with plans for flex sigmoidoscopy as needed based on symptoms.  Assessment & Plan:  Principal Problem: Pancolitis ([place]) Active Problems: GERD (gastroesophageal reflux disease) Mixed hyperlipidemia Anemia Hypokalemia Hyperbilirubinemia  1. Pancolitis-ongoing, but with clinical improvement. Associated with likely new onset IBD with ulcerative colitis. Appreciate GI recommendations; continue with scheduled IV steroids as prescribed. Continue on IV fluids as well as antiemetics as well as ciprofloxacin and Flagyl IV. Diet advanced and tolerated. 2. GERD. Continue Protonix. 3. Dyslipidemia. Continue statin. 4. Anemia-stable.  Will avoid CBC in a.m. 5. Hypokalemia-resolved. Undergoing IV replacement  and recheck CMP in a.m. 6. Hyperbilirubinemia. Continue IV hydration and monitor CMP in a.m.    DVT prophylaxis:SCDs Code Status:Full Family Communication:None at bedside Disposition Plan:IV hydration and abx treatment   Consultants:  GI  Procedures:  None  Antimicrobials:  Cipro and Flagyl 8/29->   Subjective: Patient seen and evaluated today with no new acute complaints or concerns. No acute  concerns or events noted overnight.  She states that her abdominal pain is improved but continues to have low volume, watery stools.  She is having more to eat this morning and seems to think that she is overall improving.  Objective: Vitals:   09/09/17 0531 09/09/17 1345 09/09/17 2125 09/10/17 0528  BP: 105/62 111/65 113/71 115/71  Pulse: 79 78 78 73  Resp: 16 18 16 16   Temp: 98 F (36.7 C) 97.8 F (36.6 C) (!) 97.4 F (36.3 C) 98.1 F (36.7 C)  TempSrc: Oral Oral Oral Oral  SpO2: 96% 99% 98% 98%  Weight:      Height:        Intake/Output Summary (Last 24 hours) at 09/10/2017 1224 Last data filed at 09/09/2017 2143 Gross per 24 hour  Intake 1626.67 ml  Output -  Net 1626.67 ml   Filed Weights   09/08/17 0053 09/08/17 0500  Weight: 71.5 kg 71.5 kg    Examination:  General exam: Appears calm and comfortable  Respiratory system: Clear to auscultation. Respiratory effort normal. Cardiovascular system: S1 & S2 heard, RRR. No JVD, murmurs, rubs, gallops or clicks. No pedal edema. Gastrointestinal system: Abdomen is nondistended, soft and nontender. No organomegaly or masses felt. Normal bowel sounds heard. Central nervous system: Alert and oriented. No focal neurological deficits. Extremities: Symmetric 5 x 5 power. Skin: No rashes, lesions or ulcers Psychiatry: Judgement and insight appear normal. Mood & affect appropriate.     Data Reviewed: I have personally reviewed following labs and imaging studies  CBC: Recent Labs  Lab 09/07/17 1651 09/08/17 0353 09/09/17 0705 09/10/17 0641  WBC 7.6 6.9 7.7 6.4  NEUTROABS 5.7 6.2  --   --   HGB 10.7* 9.1* 9.0* 9.0*  HCT 31.4* 27.4* 27.0* 27.5*  MCV 91.3 92.3 93.1 93.2  PLT 267 233  276 626   Basic Metabolic Panel: Recent Labs  Lab 09/07/17 1635 09/07/17 1651 09/08/17 0353 09/09/17 0705 09/10/17 0641  NA  --  134* 139 140 140  K  --  2.8* 3.4* 3.5 3.9  CL  --  100 109 107 107  CO2  --  24 22 24 26   GLUCOSE  --   116* 172* 141* 119*  BUN  --  11 6 8 9   CREATININE  --  0.75 0.66 0.54 0.53  CALCIUM  --  8.1* 7.3* 7.9* 7.9*  MG 1.7  --   --   --   --   PHOS 2.8  --   --   --   --    GFR: Estimated Creatinine Clearance: 87.9 mL/min (by C-G formula based on SCr of 0.53 mg/dL). Liver Function Tests: Recent Labs  Lab 09/07/17 1651 09/08/17 0353 09/09/17 0705 09/10/17 0641  AST 13* 10* 13* 10*  ALT 11 12 11 11   ALKPHOS 87 70 61 55  BILITOT 1.3* 0.9 0.4 0.4  PROT 6.7 5.7* 5.6* 5.4*  ALBUMIN 3.0* 2.5* 2.5* 2.5*   Recent Labs  Lab 09/07/17 1651  LIPASE 18   No results for input(s): AMMONIA in the last 168 hours. Coagulation Profile: No results for input(s): INR, PROTIME in the last 168 hours. Cardiac Enzymes: No results for input(s): CKTOTAL, CKMB, CKMBINDEX, TROPONINI in the last 168 hours. BNP (last 3 results) No results for input(s): PROBNP in the last 8760 hours. HbA1C: No results for input(s): HGBA1C in the last 72 hours. CBG: No results for input(s): GLUCAP in the last 168 hours. Lipid Profile: No results for input(s): CHOL, HDL, LDLCALC, TRIG, CHOLHDL, LDLDIRECT in the last 72 hours. Thyroid Function Tests: No results for input(s): TSH, T4TOTAL, FREET4, T3FREE, THYROIDAB in the last 72 hours. Anemia Panel: No results for input(s): VITAMINB12, FOLATE, FERRITIN, TIBC, IRON, RETICCTPCT in the last 72 hours. Sepsis Labs: No results for input(s): PROCALCITON, LATICACIDVEN in the last 168 hours.  Recent Results (from the past 240 hour(s))  MRSA PCR Screening     Status: None   Collection Time: 09/08/17 12:45 AM  Result Value Ref Range Status   MRSA by PCR NEGATIVE NEGATIVE Final    Comment:        The GeneXpert MRSA Assay (FDA approved for NASAL specimens only), is one component of a comprehensive MRSA colonization surveillance program. It is not intended to diagnose MRSA infection nor to guide or monitor treatment for MRSA infections. Performed at [facility], [address] [place], [place] [address]          Radiology Studies: No results found.      Scheduled Meds: . atorvastatin  10 mg Oral Daily  . methylPREDNISolone (SOLU-MEDROL) injection  30 mg Intravenous Q8H  . pantoprazole  40 mg Oral Daily   Continuous Infusions: . 0.45 % NaCl with KCl 20 mEq / L 75 mL/hr at 09/10/17 0648  . ciprofloxacin 400 mg (09/10/17 0948)  . metronidazole Stopped (09/10/17 0746)     LOS: 0 days    Time spent: 30 minutes    Kahiau Schewe Darleen Crocker, DO [facility] Pager [phone]  If 7PM-7AM, please contact night-coverage [email or web address] Password [facility] 09/10/2017, 12:24 PM

## 2017-09-11 LAB — COMPREHENSIVE METABOLIC PANEL
ALK PHOS: 56 U/L (ref 38–126)
ALT: 13 U/L (ref 0–44)
AST: 14 U/L — ABNORMAL LOW (ref 15–41)
Albumin: 2.7 g/dL — ABNORMAL LOW (ref 3.5–5.0)
Anion gap: 5 (ref 5–15)
BUN: 8 mg/dL (ref 6–20)
CALCIUM: 8.1 mg/dL — AB (ref 8.9–10.3)
CO2: 27 mmol/L (ref 22–32)
CREATININE: 0.6 mg/dL (ref 0.44–1.00)
Chloride: 107 mmol/L (ref 98–111)
Glucose, Bld: 123 mg/dL — ABNORMAL HIGH (ref 70–99)
Potassium: 4.1 mmol/L (ref 3.5–5.1)
SODIUM: 139 mmol/L (ref 135–145)
Total Bilirubin: 0.6 mg/dL (ref 0.3–1.2)
Total Protein: 5.6 g/dL — ABNORMAL LOW (ref 6.5–8.1)

## 2017-09-11 MED ORDER — PREDNISONE 20 MG PO TABS: 40.0000 mg | ORAL_TABLET | Freq: Every day | ORAL | 0 refills | Status: AC

## 2017-09-11 MED ORDER — PANTOPRAZOLE SODIUM 40 MG PO TBEC: 40.0000 mg | DELAYED_RELEASE_TABLET | Freq: Every day | ORAL | 0 refills | Status: DC

## 2017-09-11 NOTE — Progress Notes (Signed)
PROGRESS NOTE    Christina Richardson  ION:[id number] DOB: 26-Jan-1965 DOA: 09/07/2017 PCP: Loman Brooklyn, FNP   Brief Narrative:   Christina Richardson a 52 y.o.femalewith medical history significant of gastroenteritis, GERD, mixed hyperlipidemia, seasonal allergies who is coming with complaints of exacerbation of chronic diarrhea, frequent nausea and one episode of emesis Monday.She is noted to have frequent, intermittent episodes of diarrhea over the past 2 years with negative outpatient work-up noted thus far. She was noted to have pancolitis on abdominal CT for which she has been started on IV ciprofloxacin and Flagyl. She has been seen by GI with concern for new onset IBD with ulcerative colitis for which she has been started on IV steroids with plans for flex sigmoidoscopy as needed based on symptoms.  Assessment & Plan:  Principal Problem: Pancolitis ([place]) Active Problems: GERD (gastroesophageal reflux disease) Mixed hyperlipidemia Anemia Hypokalemia Hyperbilirubinemia  1. Pancolitis-ongoing symptomatology. Associated with likely new onset IBD with ulcerative colitis. Appreciate GI recommendations; continue with scheduled IV steroids as prescribed. Continue on IV fluids as well as antiemetics as well as ciprofloxacin and Flagyl IV. Diet advanced and tolerated. 2. GERD.Continue Protonix. 3. Dyslipidemia.Continuestatin. 4. Anemia-stable.  Will avoid CBC in a.m. 5. Hypokalemia-resolved. Undergoing IV replacement and recheck BMP in a.m. 6. Hyperbilirubinemia. Continue IV hydration; this appears to have resolved.    DVT prophylaxis:SCDs Code Status:Full Family Communication:None at bedside Disposition Plan:IV hydration and abx treatment   Consultants:  GI  Procedures:  None  Antimicrobials:  Cipro and Flagyl 8/29->  Subjective: Patient seen and evaluated today with no new acute complaints or concerns. No acute concerns or  events noted overnight.  She continues to have ongoing diarrhea with no improvement noted overnight.  She is still tolerating her diet.  Objective: Vitals:   09/10/17 0528 09/10/17 1331 09/10/17 2128 09/11/17 0518  BP: 115/71 121/68 124/67 131/75  Pulse: 73 81 86 76  Resp: 16 18 16 16   Temp: 98.1 F (36.7 C) 98.1 F (36.7 C) 98.5 F (36.9 C) 98 F (36.7 C)  TempSrc: Oral Oral Oral Oral  SpO2: 98% 98% 99% 97%  Weight:      Height:        Intake/Output Summary (Last 24 hours) at 09/11/2017 1050 Last data filed at 09/11/2017 0200 Gross per 24 hour  Intake 6400.83 ml  Output -  Net 6400.83 ml   Filed Weights   09/08/17 0053 09/08/17 0500  Weight: 71.5 kg 71.5 kg    Examination:  General exam: Appears calm and comfortable  Respiratory system: Clear to auscultation. Respiratory effort normal. Cardiovascular system: S1 & S2 heard, RRR. No JVD, murmurs, rubs, gallops or clicks. No pedal edema. Gastrointestinal system: Abdomen is nondistended, soft and nontender. No organomegaly or masses felt. Normal bowel sounds heard. Central nervous system: Alert and oriented. No focal neurological deficits. Extremities: Symmetric 5 x 5 power. Skin: No rashes, lesions or ulcers Psychiatry: Judgement and insight appear normal. Mood & affect appropriate.     Data Reviewed: I have personally reviewed following labs and imaging studies  CBC: Recent Labs  Lab 09/07/17 1651 09/08/17 0353 09/09/17 0705 09/10/17 0641  WBC 7.6 6.9 7.7 6.4  NEUTROABS 5.7 6.2  --   --   HGB 10.7* 9.1* 9.0* 9.0*  HCT 31.4* 27.4* 27.0* 27.5*  MCV 91.3 92.3 93.1 93.2  PLT 267 233 276 244   Basic Metabolic Panel: Recent Labs  Lab 09/07/17 1635 09/07/17 1651 09/08/17 0353 09/09/17 0705 09/10/17 0641 09/11/17 0624  NA  --  134* 139 140 140 139  K  --  2.8* 3.4* 3.5 3.9 4.1  CL  --  100 109 107 107 107  CO2  --  24 22 24 26 27   GLUCOSE  --  116* 172* 141* 119* 123*  BUN  --  11 6 8 9 8   CREATININE  --   0.75 0.66 0.54 0.53 0.60  CALCIUM  --  8.1* 7.3* 7.9* 7.9* 8.1*  MG 1.7  --   --   --   --   --   PHOS 2.8  --   --   --   --   --    GFR: Estimated Creatinine Clearance: 87.9 mL/min (by C-G formula based on SCr of 0.6 mg/dL). Liver Function Tests: Recent Labs  Lab 09/07/17 1651 09/08/17 0353 09/09/17 0705 09/10/17 0641 09/11/17 0624  AST 13* 10* 13* 10* 14*  ALT 11 12 11 11 13   ALKPHOS 87 70 61 55 56  BILITOT 1.3* 0.9 0.4 0.4 0.6  PROT 6.7 5.7* 5.6* 5.4* 5.6*  ALBUMIN 3.0* 2.5* 2.5* 2.5* 2.7*   Recent Labs  Lab 09/07/17 1651  LIPASE 18   No results for input(s): AMMONIA in the last 168 hours. Coagulation Profile: No results for input(s): INR, PROTIME in the last 168 hours. Cardiac Enzymes: No results for input(s): CKTOTAL, CKMB, CKMBINDEX, TROPONINI in the last 168 hours. BNP (last 3 results) No results for input(s): PROBNP in the last 8760 hours. HbA1C: No results for input(s): HGBA1C in the last 72 hours. CBG: No results for input(s): GLUCAP in the last 168 hours. Lipid Profile: No results for input(s): CHOL, HDL, LDLCALC, TRIG, CHOLHDL, LDLDIRECT in the last 72 hours. Thyroid Function Tests: No results for input(s): TSH, T4TOTAL, FREET4, T3FREE, THYROIDAB in the last 72 hours. Anemia Panel: No results for input(s): VITAMINB12, FOLATE, FERRITIN, TIBC, IRON, RETICCTPCT in the last 72 hours. Sepsis Labs: No results for input(s): PROCALCITON, LATICACIDVEN in the last 168 hours.  Recent Results (from the past 240 hour(s))  MRSA PCR Screening     Status: None   Collection Time: 09/08/17 12:45 AM  Result Value Ref Range Status   MRSA by PCR NEGATIVE NEGATIVE Final    Comment:        The GeneXpert MRSA Assay (FDA approved for NASAL specimens only), is one component of a comprehensive MRSA colonization surveillance program. It is not intended to diagnose MRSA infection nor to guide or monitor treatment for MRSA infections. Performed at [facility], [address] [place], [place] [address]          Radiology Studies: No results found.      Scheduled Meds: . atorvastatin  10 mg Oral Daily  . methylPREDNISolone (SOLU-MEDROL) injection  30 mg Intravenous Q8H  . pantoprazole  40 mg Oral Daily   Continuous Infusions: . 0.45 % NaCl with KCl 20 mEq / L 75 mL/hr at 09/11/17 0622  . ciprofloxacin 400 mg (09/11/17 0943)  . metronidazole Stopped (09/11/17 3704)     LOS: 1 day    Time spent: 30 minutes    Christina Richardson Darleen Crocker, DO [facility] Pager [phone]  If 7PM-7AM, please contact night-coverage [email or web address] Password [email or web address] 09/11/2017, 10:50 AM

## 2017-09-11 NOTE — Progress Notes (Signed)
Removed both IVs-clean, dry, intact. Reviewed d/c paperwork with patient. Reviewed new medications and where to [facility] up. Answered all questions. Gave her doctor's note. Walked stable patient and belongings to elevator. She walked out with both of her adult daughters.

## 2017-09-11 NOTE — Progress Notes (Signed)
Patient feeling much better. No more blood in stools. Only 2 loose stools so far today. Tolerating diet. Abdominal pain has resolved.  Vital signs in last 24 hours: Temp:  [98 F (36.7 C)-98.5 F (36.9 C)] 98 F (36.7 C) (09/02 0518) Pulse Rate:  [76-86] 76 (09/02 0518) Resp:  [16-18] 16 (09/02 0518) BP: (121-131)/(67-75) 131/75 (09/02 0518) SpO2:  [97 %-99 %] 97 % (09/02 0518) Last BM Date: 09/11/17 General:   Alert,  pleasant and cooperative in NAD Abdomen:  Nondistended. Positive bowel sounds.  Soft and nontender. No mass or organomegaly. Extremities:  Without clubbing or edema.    Intake/Output from previous day: 09/01 0701 - 09/02 0700 In: 6640.8 [P.O.:840; I.V.:2647.5; IV Piggyback:3153.3] Out: -  Intake/Output this shift: No intake/output data recorded.  Lab Results: Recent Labs    09/09/17 0705 09/10/17 0641  WBC 7.7 6.4  HGB 9.0* 9.0*  HCT 27.0* 27.5*  PLT 276 303   BMET Recent Labs    09/09/17 0705 09/10/17 0641 09/11/17 0624  NA 140 140 139  K 3.5 3.9 4.1  CL 107 107 107  CO2 24 26 27   GLUCOSE 141* 119* 123*  BUN 8 9 8   CREATININE 0.54 0.53 0.60  CALCIUM 7.9* 7.9* 8.1*   LFT Recent Labs    09/11/17 0624  PROT 5.6*  ALBUMIN 2.7*  AST 14*  ALT 13  ALKPHOS 56  BILITOT 0.6    Impression:  Pancolitis likely due to active colitis exacerbated by excessive NSAID use.  Clinically, much improved on Solu-Medrol ( antibiotics likely superfluous)  Recommendations:   Begin prednisone 40 mg daily. Discontinue Solu-Medrol today. Avoid all nonsteroidal                 agents.     Low-residue diet.  Wrap-up antibiotic therapy (5 day -total course should suffice).     The patient wants her GI care through [facility];  Colonoscopy needs to be deferred for now.     Office visit with [place] in one month. From a GI standpoint, can be discharged any time.

## 2017-09-11 NOTE — Discharge Summary (Signed)
Physician Discharge Summary  Christina Richardson GBT:[id number] DOB: [birth year] DOA: 09/07/2017  PCP: Loman Brooklyn, FNP  Admit date: 09/07/2017  Discharge date: 09/11/2017  Admitted From:Home  Disposition:  Home  Recommendations for Outpatient Follow-up:  1. Follow up with PCP in 1-2 weeks 2. Follow-up with GI Dr. Gala Romney in 4 weeks as recommended  Home Health:N/A  Equipment/Devices:None  Discharge Condition:Stable  CODE STATUS: Full  Diet recommendation: Heart Healthy, low residue diet  Brief/Interim Summary: Christina Richardson a 52 y.o.femalewith medical history significant of gastroenteritis, GERD, mixed hyperlipidemia, seasonal allergies who is coming with complaints of exacerbation of chronic diarrhea, frequent nausea and one episode of emesis Monday.She is noted to have frequent, intermittent episodes of diarrhea over the past 2 years with negative outpatient work-up noted thus far. She was noted to have pancolitis on abdominal CT for which she has been started on IV ciprofloxacin and Flagyl. She has been seen by GI with concern for new onset IBD with ulcerative colitis for which she has been started on IV steroids.  She continues to have some ongoing mild diarrhea, but is overall doing much better and is tolerating her diet.  She had been seen by gastroenterology earlier today Dr. Gala Romney with recommendations to remain on 40 mg of prednisone over the next 30 days and follow-up in the office for colonoscopy likely at that time.  She is to remain on a low residue diet with no further need for antibiotics.  Her laboratory data have remained stable.  Discharge Diagnoses:  Principal Problem:   Pancolitis ([place]) Active Problems:   GERD (gastroesophageal reflux disease)   Mixed hyperlipidemia   Anemia   Hypokalemia   Hyperbilirubinemia   Colitis    Discharge Instructions   Allergies as of 09/11/2017      Reactions   Nicotine Rash, Other (See Comments)   Patch causes  Nightmares       Medication List    STOP taking these medications   PLENVU 140 g Solr Generic drug:  PEG-KCl-NaCl-NaSulf-Na Asc-C     TAKE these medications   atorvastatin 10 MG tablet Commonly known as:  LIPITOR Take 10 mg by mouth daily.   colestipol 1 g tablet Commonly known as:  COLESTID Take 1 g by mouth 2 (two) times daily.   dicyclomine 10 MG capsule Commonly known as:  BENTYL Take 10 mg by mouth 2 (two) times daily as needed for spasms.   hydrocortisone 2.5 % rectal cream Commonly known as:  ANUSOL-HC Place 1 application rectally daily as needed for hemorrhoids or anal itching.   hyoscyamine 0.125 MG SL tablet Commonly known as:  LEVSIN SL Take 0.125 mg by mouth 2 (two) times daily as needed for cramping.   omeprazole 40 MG capsule Commonly known as:  PRILOSEC Take 1 capsule (40 mg total) by mouth daily.   ondansetron 4 MG tablet Commonly known as:  ZOFRAN Take 4 mg by mouth every 8 (eight) hours as needed for nausea or vomiting.   pantoprazole 40 MG tablet Commonly known as:  PROTONIX Take 1 tablet (40 mg total) by mouth daily. Start taking on:  09/12/2017   predniSONE 20 MG tablet Commonly known as:  DELTASONE Take 2 tablets (40 mg total) by mouth daily.   PREMARIN 0.625 MG tablet Generic drug:  estrogens (conjugated) Take 0.625 mg by mouth daily.   SF 5000 PLUS 1.1 % Crea dental cream Generic drug:  sodium fluoride Place 1 application onto teeth See admin instructions. Brush two to three  times daily-Spit out excess      Follow-up Information    Loman Brooklyn, FNP Follow up in 2 week(s).   Specialty:  Family Medicine Contact information: [place] [address] [phone]        Daneil Dolin, MD Follow up in 1 month(s).   Specialty:  Gastroenterology Contact information: [place] [address] [phone]          Allergies  Allergen Reactions  . Nicotine Rash and Other (See Comments)     Patch causes Nightmares     Consultations:  GI Dr. Gala Romney   Procedures/Studies: Ct Abdomen Pelvis W Contrast  Result Date: 09/07/2017 CLINICAL DATA:  Gastroenteritis and diarrhea x3 weeks with fever. EXAM: CT ABDOMEN AND PELVIS WITH CONTRAST TECHNIQUE: Multidetector CT imaging of the abdomen and pelvis was performed using the standard protocol following bolus administration of intravenous contrast. CONTRAST:  134mL ISOVUE-300 IOPAMIDOL (ISOVUE-300) INJECTION 61% COMPARISON:  None. FINDINGS: Lower chest: No acute abnormality. Hepatobiliary: Fatty infiltration along the falciform. No space-occupying mass of the liver. The gallbladder is physiologically distended without calculi. No biliary dilatation. Pancreas: Normal Spleen: Normal Adrenals/Urinary Tract: Bilateral adrenal nodules measuring 2.2 x 1.3 cm on the right and 0.8 x 0.7 cm on the left. Symmetric cortical enhancement of both kidneys. No nephrolithiasis nor obstructive uropathy. No hydroureteronephrosis. The urinary bladder is unremarkable for the degree of distention. Stomach/Bowel: Physiologically distended stomach. The duodenal sweep and ligament of Treitz are normal. No significant small bowel dilatation or inflammation. There is diffuse transmural thickening of the colon consistent with pancolitis. No bowel perforation or obstruction. What appears to be appendix is slightly fluid-filled and likely sympathetic secondary to the underlying colitis. Vascular/Lymphatic: Moderate aortoiliac atherosclerosis. No lymphadenopathy by CT size criteria. Small retroperitoneal lymph nodes and retroperitoneal fatty induration is identified, nonspecific. Reproductive: Hysterectomy.  No adnexal mass. Other: No free air.  No free fluid. Musculoskeletal: No acute or significant osseous findings. IMPRESSION: Diffuse pancolitis is identified. No bowel perforation or obstruction. Bilateral nonspecific adrenal nodules measuring 2.2 x 1.3 cm on the right and 0.8 x  0.7 cm on left. Electronically Signed   By: Ashley Royalty M.D.   On: 09/07/2017 21:40     Discharge Exam: Vitals:   09/10/17 2128 09/11/17 0518  BP: 124/67 131/75  Pulse: 86 76  Resp: 16 16  Temp: 98.5 F (36.9 C) 98 F (36.7 C)  SpO2: 99% 97%   Vitals:   09/10/17 0528 09/10/17 1331 09/10/17 2128 09/11/17 0518  BP: 115/71 121/68 124/67 131/75  Pulse: 73 81 86 76  Resp: 16 18 16 16   Temp: 98.1 F (36.7 C) 98.1 F (36.7 C) 98.5 F (36.9 C) 98 F (36.7 C)  TempSrc: Oral Oral Oral Oral  SpO2: 98% 98% 99% 97%  Weight:      Height:        General: Pt is alert, awake, not in acute distress Cardiovascular: RRR, S1/S2 +, no rubs, no gallops Respiratory: CTA bilaterally, no wheezing, no rhonchi Abdominal: Soft, NT, ND, bowel sounds + Extremities: no edema, no cyanosis    The results of significant diagnostics from this hospitalization (including imaging, microbiology, ancillary and laboratory) are listed below for reference.     Microbiology: Recent Results (from the past 240 hour(s))  MRSA PCR Screening     Status: None   Collection Time: 09/08/17 12:45 AM  Result Value Ref Range Status   MRSA by PCR NEGATIVE NEGATIVE Final  Comment:        The GeneXpert MRSA Assay (FDA approved for NASAL specimens only), is one component of a comprehensive MRSA colonization surveillance program. It is not intended to diagnose MRSA infection nor to guide or monitor treatment for MRSA infections. Performed at [facility], [address], [place],  [address]      Labs: BNP (last 3 results) No results for input(s): BNP in the last 8760 hours. Basic Metabolic Panel: Recent Labs  Lab 09/07/17 1635 09/07/17 1651 09/08/17 0353 09/09/17 0705 09/10/17 0641 09/11/17 0624  NA  --  134* 139 140 140 139  K  --  2.8* 3.4* 3.5 3.9 4.1  CL  --  100 109 107 107 107  CO2  --  24 22 24 26 27   GLUCOSE  --  116* 172* 141* 119* 123*  BUN  --  11 6 8 9 8   CREATININE  --  0.75  0.66 0.54 0.53 0.60  CALCIUM  --  8.1* 7.3* 7.9* 7.9* 8.1*  MG 1.7  --   --   --   --   --   PHOS 2.8  --   --   --   --   --    Liver Function Tests: Recent Labs  Lab 09/07/17 1651 09/08/17 0353 09/09/17 0705 09/10/17 0641 09/11/17 0624  AST 13* 10* 13* 10* 14*  ALT 11 12 11 11 13   ALKPHOS 87 70 61 55 56  BILITOT 1.3* 0.9 0.4 0.4 0.6  PROT 6.7 5.7* 5.6* 5.4* 5.6*  ALBUMIN 3.0* 2.5* 2.5* 2.5* 2.7*   Recent Labs  Lab 09/07/17 1651  LIPASE 18   No results for input(s): AMMONIA in the last 168 hours. CBC: Recent Labs  Lab 09/07/17 1651 09/08/17 0353 09/09/17 0705 09/10/17 0641  WBC 7.6 6.9 7.7 6.4  NEUTROABS 5.7 6.2  --   --   HGB 10.7* 9.1* 9.0* 9.0*  HCT 31.4* 27.4* 27.0* 27.5*  MCV 91.3 92.3 93.1 93.2  PLT 267 233 276 303   Cardiac Enzymes: No results for input(s): CKTOTAL, CKMB, CKMBINDEX, TROPONINI in the last 168 hours. BNP: Invalid input(s): POCBNP CBG: No results for input(s): GLUCAP in the last 168 hours. D-Dimer No results for input(s): DDIMER in the last 72 hours. Hgb A1c No results for input(s): HGBA1C in the last 72 hours. Lipid Profile No results for input(s): CHOL, HDL, LDLCALC, TRIG, CHOLHDL, LDLDIRECT in the last 72 hours. Thyroid function studies No results for input(s): TSH, T4TOTAL, T3FREE, THYROIDAB in the last 72 hours.  Invalid input(s): FREET3 Anemia work up No results for input(s): VITAMINB12, FOLATE, FERRITIN, TIBC, IRON, RETICCTPCT in the last 72 hours. Urinalysis    Component Value Date/Time   COLORURINE YELLOW 09/07/2017 1635   APPEARANCEUR HAZY (A) 09/07/2017 1635   LABSPEC 1.013 09/07/2017 1635   PHURINE 6.0 09/07/2017 1635   GLUCOSEU NEGATIVE 09/07/2017 1635   HGBUR SMALL (A) 09/07/2017 1635   BILIRUBINUR NEGATIVE 09/07/2017 1635   KETONESUR 20 (A) 09/07/2017 1635   PROTEINUR 30 (A) 09/07/2017 1635   UROBILINOGEN 0.2 06/06/2013 1554   NITRITE NEGATIVE 09/07/2017 1635   LEUKOCYTESUR NEGATIVE 09/07/2017 1635   Sepsis  Labs Invalid input(s): PROCALCITONIN,  WBC,  LACTICIDVEN Microbiology Recent Results (from the past 240 hour(s))  MRSA PCR Screening     Status: None   Collection Time: 09/08/17 12:45 AM  Result Value Ref Range Status   MRSA by PCR NEGATIVE NEGATIVE Final    Comment:  The GeneXpert MRSA Assay (FDA approved for NASAL specimens only), is one component of a comprehensive MRSA colonization surveillance program. It is not intended to diagnose MRSA infection nor to guide or monitor treatment for MRSA infections. Performed at [facility], [address] [place], [place] [address]      Time coordinating discharge: 25 minutes  SIGNED:   Rodena Goldmann, DO [facility] 09/11/2017, 1:45 PM Pager [phone]  If 7PM-7AM, please contact night-coverage [email or web address] Password [email or web address]

## 2017-09-12 ENCOUNTER — Telehealth: Payer: Self-pay | Admitting: Gastroenterology

## 2017-09-12 NOTE — Telephone Encounter (Signed)
PATIENT COMING 10/12/17, SHE WAS CALLED AND IS AWARE OF DATE AND TIME

## 2017-09-12 NOTE — Telephone Encounter (Signed)
Thank you :)

## 2017-09-12 NOTE — Telephone Encounter (Signed)
Patient needs hospital follow up in 3-4 weeks, may use urgent. She was discharged on prednisone for likely IBD.   If you cannot find an appointment within 3-4 weeks, please let me know so patient does not have disruption in her steroids.

## 2017-10-05 ENCOUNTER — Ambulatory Visit: Payer: Commercial Managed Care - PPO | Admitting: Nurse Practitioner

## 2017-10-12 ENCOUNTER — Ambulatory Visit: Payer: Commercial Managed Care - PPO | Admitting: Gastroenterology

## 2017-10-12 ENCOUNTER — Encounter: Payer: Self-pay | Admitting: Gastroenterology

## 2017-10-12 ENCOUNTER — Ambulatory Visit (INDEPENDENT_AMBULATORY_CARE_PROVIDER_SITE_OTHER): Payer: Commercial Managed Care - PPO | Admitting: Gastroenterology

## 2017-10-12 VITALS — BP 100/69 | HR 82 | Temp 97.2°F | Ht 69.0 in | Wt 148.6 lb

## 2017-10-12 DIAGNOSIS — K5100 Ulcerative (chronic) pancolitis without complications: Secondary | ICD-10-CM | POA: Diagnosis not present

## 2017-10-12 MED ORDER — PREDNISONE 10 MG PO TABS: ORAL_TABLET | ORAL | 0 refills | Status: DC

## 2017-10-12 NOTE — Patient Instructions (Signed)
Please come by Monday to pick up FMLA papers.   Start Prednisone taper.  Take 30 mg daily for 5 days, 20 mg daily for 5 days, 10 mg daily for 5 days, 5 mg daily for 5 days.  If at any time your symptoms worsen please let us know.  I will be in touch with you regarding when Dr. Gala Romney wants to do your colonoscopy.

## 2017-10-12 NOTE — Progress Notes (Signed)
Primary Care Physician: Loman Brooklyn, FNP  Primary Gastroenterologist:  Garfield Cornea, MD   Chief Complaint  Patient presents with  . Diarrhea    have stools about 5-12 times per day. stools are never formed  . Abdominal Pain    'cramping a lot"  . Nausea    w/ "dry heaves"     HPI: Christina Richardson is a 52 y.o. female here for hospital follow-up.  We initially met her during hospitalization back in August.  She has a 2-year history of intermittent diarrhea, limited work-up by PCP.  Was scheduled to see [facility] for colonoscopy but she ended up in the hospital on August 29.  History of intense abdominal pain, bloody diarrhea.  Outpatient stool for C. difficile and O&P were negative.  CT showed diffuse pancolitis, suspected to be new onset IBD. Given IV steroids inpatient and transition to oral prednisone.  She has been on 40 mg daily, last dose yesterday.  Of note patient had been taking significant amounts of ibuprofen and Aleve for 3 weeks for low back pain and headache prior to onset of acute on chronic symptoms.  Patient tried and failed Colestid, dicyclomine, Levsin ureide while inpatient on IV Solu-Medrol, her symptoms significantly improved.  At discharge her hemoglobin was 9, hematocrit 27.5, albumin 2.5.  Patient reports the amount of blood per rectum is much less.  Still with abdominal cramping, unrelieved with dicyclomine or Levsin.  Stools are soft to watery.  She is eating a bland diet including chicken breast every day (baked), applesauce, toast, bananas.  Unable to tolerate hamburger, 5 foods.  She is avoiding carbonation and caffeine.  She is eating every 2-3 hours.  Interestingly she does pretty well during the day but between 9 PM and 6 AM every night she has multiple soft to watery stools, 5-10 stools daily.  May have a couple of BMs during the workday at this point.  Patient requests we complete FMLA forms, her employer would not accept FMLA forms from her  PCP.   Current Outpatient Medications  Medication Sig Dispense Refill  . atorvastatin (LIPITOR) 10 MG tablet Take 10 mg by mouth daily.     . Biotin w/ Vitamins C & E (HAIR/SKIN/NAILS PO) Take by mouth daily. 2 gummies    . clobetasol cream (TEMOVATE) 9.32 % Apply 1 application topically as needed.    . hydrocortisone (ANUSOL-HC) 2.5 % rectal cream Place 1 application rectally daily as needed for hemorrhoids or anal itching.     Marland Kitchen KRILL OIL PO Take 1 tablet by mouth daily.    . Lactobacillus-Inulin (CULTURELLE DIGESTIVE HEALTH PO) Take 1 tablet by mouth daily.    . Multiple Vitamin (MULTIVITAMIN) tablet Take 2 tablets by mouth daily.    Marland Kitchen omeprazole (PRILOSEC) 40 MG capsule Take 1 capsule (40 mg total) by mouth daily. 30 capsule 0  . ondansetron (ZOFRAN) 4 MG tablet Take 4 mg by mouth every 8 (eight) hours as needed for nausea or vomiting.      No current facility-administered medications for this visit.     Allergies as of 10/12/2017 - Review Complete 10/12/2017  Allergen Reaction Noted  . Nicotine Rash and Other (See Comments) 04/13/2017   Past Medical History:  Diagnosis Date  . Gastroenteritis   . GERD (gastroesophageal reflux disease)   . Mixed hyperlipidemia   . Seasonal allergies    Past Surgical History:  Procedure Laterality Date  . ABDOMINAL HYSTERECTOMY  With oopherectomy   Family History  Problem Relation Age of Onset  . Heart attack Mother   . Stroke Mother   . Diabetes Mother   . Stroke Maternal Grandmother   . Diabetes Maternal Grandmother   . Congestive Heart Failure Maternal Grandmother   . Lung disease Sister   . Stroke Maternal Uncle    Social History   Tobacco Use  . Smoking status: Former Smoker    Years: 30.00    Types: Cigarettes    Last attempt to quit: 04/09/2014    Years since quitting: 3.5  . Smokeless tobacco: Never Used  Substance Use Topics  . Alcohol use: No  . Drug use: No    ROS:  General: Negative for anorexia,  fever,  chills, fatigue, weakness. Her weight is down 10 pounds since 08/2017.  ENT: Negative for hoarseness, difficulty swallowing , nasal congestion. CV: Negative for chest pain, angina, palpitations, dyspnea on exertion, peripheral edema.  Respiratory: Negative for dyspnea at rest, dyspnea on exertion, cough, sputum, wheezing.  GI: See history of present illness. GU:  Negative for dysuria, hematuria, urinary incontinence, urinary frequency, nocturnal urination.  Endo: Negative for unusual weight change.    Physical Examination:   BP 100/69   Pulse 82   Temp (!) 97.2 F (36.2 C) (Oral)   Ht '5\' 9"'  (1.753 m)   Wt 148 lb 9.6 oz (67.4 kg)   BMI 21.94 kg/m   General: Well-nourished, well-developed in no acute distress.  Eyes: No icterus. Mouth: Oropharyngeal mucosa moist and pink , no lesions erythema or exudate. Lungs: Clear to auscultation bilaterally.  Heart: Regular rate and rhythm, no murmurs rubs or gallops.  Abdomen: Bowel sounds are normal, mild diffuse tenderness, nondistended, no hepatosplenomegaly or masses, no abdominal bruits or hernia , no rebound or guarding.   Extremities: No lower extremity edema. No clubbing or deformities. Neuro: Alert and oriented x 4   Skin: Warm and dry, no jaundice.   Psych: Alert and cooperative, normal mood and affect.  Labs:  Lab Results  Component Value Date   CREATININE 0.60 09/11/2017   BUN 8 09/11/2017   NA 139 09/11/2017   K 4.1 09/11/2017   CL 107 09/11/2017   CO2 27 09/11/2017   Lab Results  Component Value Date   ALT 13 09/11/2017   AST 14 (L) 09/11/2017   ALKPHOS 56 09/11/2017   BILITOT 0.6 09/11/2017   Lab Results  Component Value Date   WBC 6.4 09/10/2017   HGB 9.0 (L) 09/10/2017   HCT 27.5 (L) 09/10/2017   MCV 93.2 09/10/2017   PLT 303 09/10/2017    Imaging Studies: No results found.

## 2017-10-16 ENCOUNTER — Telehealth: Payer: Self-pay | Admitting: Gastroenterology

## 2017-10-16 NOTE — Telephone Encounter (Signed)
FMLA papers completed. Patient requested we fax to her work and her husband will come by and pick up papers today.

## 2017-10-16 NOTE — Telephone Encounter (Signed)
Noted  

## 2017-10-16 NOTE — Assessment & Plan Note (Signed)
Very pleasant 52 year old female with chronic intermittent diarrhea with blood per rectum over the past 2 years, symptoms significantly worse back in August prior to hospitalization.  CT at that time showed pancolitis, suspected new onset IBD, likely ulcerative colitis.  Outpatient C. difficile/O&P were negative.  Patient did admit to significant Advil and Aleve use weeks prior to her hospitalization which may have resulted in worsening of IBD.  She has been on prednisone the past 4 weeks, significant improvement in blood per rectum, abdominal pain, still having significant nocturnal diarrhea.  Pursue colonoscopy in the near future, will address timing further with Dr. Gala Romney.  I have discussed the risks, alternatives, benefits with regards to but not limited to the risk of reaction to medication, bleeding, infection, perforation and the patient is agreeable to proceed. Written consent to be obtained.  Continue prednisone, start taper.

## 2017-10-16 NOTE — Progress Notes (Signed)
cc'd to pcp 

## 2017-10-23 ENCOUNTER — Other Ambulatory Visit: Payer: Self-pay

## 2017-10-23 ENCOUNTER — Telehealth: Payer: Self-pay | Admitting: Gastroenterology

## 2017-10-23 DIAGNOSIS — K5100 Ulcerative (chronic) pancolitis without complications: Secondary | ICD-10-CM

## 2017-10-23 MED ORDER — LIALDA 1.2 G PO TBEC: 4.8000 g | DELAYED_RELEASE_TABLET | Freq: Every day | ORAL | 5 refills | Status: DC

## 2017-10-23 NOTE — Patient Instructions (Signed)
Called [employer] for PA of TCS. Case approved. PA# [id number], 10/23/17-11/10/17.

## 2017-10-23 NOTE — Telephone Encounter (Signed)
Tried to call pt, no answer, LMOVM for return call.  

## 2017-10-23 NOTE — Addendum Note (Signed)
Addended by: Mahala Menghini on: 10/23/2017 02:29 PM   Modules accepted: Orders

## 2017-10-23 NOTE — Telephone Encounter (Signed)
Please let patient know I'm sorry for the delay.   OK to schedule colonoscopy with RMR. Can we get as soon as possible so that we can decide on her colitis therapy.   Patient already has Plenvu prep at home.

## 2017-10-23 NOTE — Telephone Encounter (Signed)
Called [employer] for PA of TCS. Case approved. PA# [id number], effective 10/23/17-11/10/17.

## 2017-10-23 NOTE — Telephone Encounter (Signed)
Patient called to find out if LSL has decided on the next step for the patient's treatment, she was going to let the patient know.  [phone]

## 2017-10-23 NOTE — Telephone Encounter (Signed)
PT is aware, OK to schedule.

## 2017-10-23 NOTE — Telephone Encounter (Signed)
Forwarding to [place] and also Candace Cruise, this is RMR pt.

## 2017-10-23 NOTE — Telephone Encounter (Signed)
Called pt, TCS w/RMR scheduled for 11/10/17 at 3:00pm. Instructions for Plenvu mailed. Orders entered.  Christina Richardson, she will finish Prednisone 10/31/17. She wants to know if there is anything else she will need after finishing the Prednisone.

## 2017-10-23 NOTE — Telephone Encounter (Signed)
She should start Lialda 4.8 grams daily at breakfast. That's four pills at breakfast daily.  I have sent in rx. She should go ahead and start Lialda now and continue prednisone taper.

## 2017-10-23 NOTE — Telephone Encounter (Signed)
Pt called office and was informed of rx and LSL recommendations.

## 2017-10-25 ENCOUNTER — Telehealth: Payer: Self-pay | Admitting: Internal Medicine

## 2017-10-25 NOTE — Telephone Encounter (Signed)
Pt called to say that her Rx of Lialda was over $700 and is there something else cheaper. [phone]

## 2017-10-25 NOTE — Telephone Encounter (Signed)
PA started on [email or web address]. Pt is aware that we are waiting on approval or denial.

## 2017-10-27 NOTE — Telephone Encounter (Signed)
Spoke with pt. She spoke with her insurance company and these medications will be covered under her insurance Azathioprine 50 mg tab, sulfasalazine 500 mg. Pt also stated that she is going to call the [employer]y and see if she's a able to apply for pt assistance. Pt is aware that we should see if this PA is approved first before sending another medication.

## 2017-10-30 MED ORDER — MESALAMINE ER 0.375 G PO CP24: 1500.0000 mg | ORAL_CAPSULE | Freq: Every day | ORAL | 3 refills | Status: DC

## 2017-10-30 NOTE — Telephone Encounter (Signed)
RX for [employer] sent.

## 2017-10-30 NOTE — Telephone Encounter (Signed)
Received denial letter for Lialda from [facility]. PT would've had to try and failed Apriso, mesalamine delayed release, Pentassa, Azulfidine, sulfasalazine, balsalazide disodium. Pt has talked to the pt assistance rep to see if she would qualify for pt assistance and she does. She will fill out paper work and bring it in for [place] to fill out our part before submitting it, if this is ok with LSL. Pt wants to take the medication LSL would like for her to take.

## 2017-10-30 NOTE — Telephone Encounter (Signed)
Pt is ok with trying Apriso. I tried to get pricing from the pharmacy and they said the only way to know the cost is to send RX in. Please send RX in to [facility].

## 2017-10-30 NOTE — Telephone Encounter (Signed)
I am ok with her trying for patient assistance but if she has insurance and another rx is covered, I don't see how she would qualify.   I am ok with Christina Richardson is she wants to see how much it costs.  I will not use Azathioprine at this time or sulfasalazine.

## 2017-10-30 NOTE — Addendum Note (Signed)
Addended by: Mahala Menghini on: 10/30/2017 10:32 PM   Modules accepted: Orders

## 2017-10-31 NOTE — Telephone Encounter (Signed)
Noted. Pt notified that RX was sent in to her pharmacy.

## 2017-10-31 NOTE — Telephone Encounter (Signed)
Pt left a VM, her copay for the medication Apriso sent in by LSL today will be $501.50

## 2017-11-02 NOTE — Telephone Encounter (Signed)
Spoke with pt. She is going to apply for pt assistance. She is going to complete her part of paper work that she prints out and will either bring or mail paper work to our office so that the application can be completed and faxed.

## 2017-11-09 ENCOUNTER — Telehealth: Payer: Self-pay

## 2017-11-09 NOTE — Telephone Encounter (Signed)
If She is got this much of dermatitis, we need to cancel her colonoscopy.  If she cannot get in touch with her PCP she needs to get in touch with whoever is on-call for her PCP.  We will reschedule at a later date.

## 2017-11-09 NOTE — Telephone Encounter (Signed)
noted 

## 2017-11-09 NOTE — Telephone Encounter (Signed)
Called pt, informed her Dr. Gala Romney recommends rescheduling TCS. TCS rescheduled to 12/06/17 at 9:45am. LMOVM for endo scheduler. New instructions mailed. Informed her she can purchase Ranitidine over the counter. She got an appt to see her PCP tomorrow morning.  Magda Paganini, she hadn't started any new medications prior to the rash. She didn't start Apriso d/t it was going to cost $500. She has been working with [employer] about getting patient assistance. She will be bringing pt assistance forms to our office tomorrow morning.

## 2017-11-09 NOTE — Telephone Encounter (Signed)
Please clarify, ANY new medications prior to this rash? I looks like she never started Apriso.   She can purchase ranitidine over the counter if she wishes. I would recommend she reach out to her PCP again.   I would not think this would interfere with TCS tomorrow. Please share info with Dr. Gala Romney to be sure.

## 2017-11-09 NOTE — Telephone Encounter (Signed)
Called pt to see if she could arrive earlier tomorrow for TCS d/t a cancellation. However, pt will be unable to arrive earlier d/t her transportation. Endo scheduler aware.  Dr. Gala Romney or Magda Paganini: she has a scattered, itchy rash on body. Splotchy red bumps. No drainage. Rash is on neck, torso, arms, and legs. She had a rash like it in the past and seen PCP and allergist but cause was unknown. She was given Prednisone and Ranitidine last time. She has been unable to get in touch with her PCP this time. She wants to know if the rash will interfere with having TCS tomorrow?

## 2017-11-21 NOTE — Telephone Encounter (Signed)
Received letter from [employer] pt assistance program, pt has been approved to receive Lialda at no cost through 11/18/2018. Letter was sent to be scanned in chart.

## 2017-12-06 ENCOUNTER — Encounter (HOSPITAL_COMMUNITY): Payer: Self-pay | Admitting: *Deleted

## 2017-12-06 ENCOUNTER — Ambulatory Visit (HOSPITAL_COMMUNITY)
Admission: RE | Admit: 2017-12-06 | Discharge: 2017-12-06 | Disposition: A | Discharge status: Home / Self Care | Payer: Commercial Managed Care - PPO | Source: Ambulatory Visit | Attending: Internal Medicine | Admitting: Internal Medicine

## 2017-12-06 ENCOUNTER — Other Ambulatory Visit: Payer: Self-pay

## 2017-12-06 ENCOUNTER — Encounter (HOSPITAL_COMMUNITY)
Admission: RE | Disposition: A | Discharge status: Home / Self Care | Payer: Self-pay | Source: Ambulatory Visit | Attending: Internal Medicine

## 2017-12-06 DIAGNOSIS — K5190 Ulcerative colitis, unspecified, without complications: Secondary | ICD-10-CM | POA: Diagnosis not present

## 2017-12-06 DIAGNOSIS — R933 Abnormal findings on diagnostic imaging of other parts of digestive tract: Secondary | ICD-10-CM | POA: Diagnosis not present

## 2017-12-06 DIAGNOSIS — Z79899 Other long term (current) drug therapy: Secondary | ICD-10-CM | POA: Diagnosis not present

## 2017-12-06 DIAGNOSIS — K5100 Ulcerative (chronic) pancolitis without complications: Secondary | ICD-10-CM

## 2017-12-06 DIAGNOSIS — K219 Gastro-esophageal reflux disease without esophagitis: Secondary | ICD-10-CM | POA: Diagnosis not present

## 2017-12-06 DIAGNOSIS — D1779 Benign lipomatous neoplasm of other sites: Secondary | ICD-10-CM | POA: Insufficient documentation

## 2017-12-06 DIAGNOSIS — E782 Mixed hyperlipidemia: Secondary | ICD-10-CM | POA: Insufficient documentation

## 2017-12-06 DIAGNOSIS — D120 Benign neoplasm of cecum: Secondary | ICD-10-CM | POA: Diagnosis not present

## 2017-12-06 DIAGNOSIS — K6389 Other specified diseases of intestine: Secondary | ICD-10-CM | POA: Diagnosis not present

## 2017-12-06 DIAGNOSIS — R948 Abnormal results of function studies of other organs and systems: Secondary | ICD-10-CM | POA: Diagnosis present

## 2017-12-06 DIAGNOSIS — Z87891 Personal history of nicotine dependence: Secondary | ICD-10-CM | POA: Insufficient documentation

## 2017-12-06 HISTORY — PX: BIOPSY: SHX5522

## 2017-12-06 HISTORY — PX: COLONOSCOPY: SHX5424

## 2017-12-06 HISTORY — PX: POLYPECTOMY: SHX5525

## 2017-12-06 SURGERY — COLONOSCOPY
Anesthesia: Moderate Sedation

## 2017-12-06 MED ORDER — SODIUM CHLORIDE 0.9 % IV SOLN
INTRAVENOUS | Status: DC
  Administered 2017-12-06: 09:00:00 via INTRAVENOUS

## 2017-12-06 MED ORDER — ONDANSETRON HCL 4 MG/2ML IJ SOLN
INTRAMUSCULAR | Status: DC | PRN
  Administered 2017-12-06: 4 mg via INTRAVENOUS

## 2017-12-06 MED ORDER — MEPERIDINE HCL 100 MG/ML IJ SOLN
INTRAMUSCULAR | Status: DC | PRN
  Administered 2017-12-06 (×2): 25 mg

## 2017-12-06 MED ORDER — STERILE WATER FOR IRRIGATION IR SOLN
Status: DC | PRN
  Administered 2017-12-06: 1.5 mL

## 2017-12-06 MED ORDER — MIDAZOLAM HCL 5 MG/5ML IJ SOLN
INTRAMUSCULAR | Status: AC
  Filled 2017-12-06: qty 10

## 2017-12-06 MED ORDER — MIDAZOLAM HCL 5 MG/5ML IJ SOLN
INTRAMUSCULAR | Status: DC | PRN
  Administered 2017-12-06 (×3): 1 mg via INTRAVENOUS
  Administered 2017-12-06: 2 mg via INTRAVENOUS
  Administered 2017-12-06: 1 mg via INTRAVENOUS

## 2017-12-06 MED ORDER — MEPERIDINE HCL 50 MG/ML IJ SOLN
INTRAMUSCULAR | Status: AC
  Filled 2017-12-06: qty 1

## 2017-12-06 MED ORDER — ONDANSETRON HCL 4 MG/2ML IJ SOLN
INTRAMUSCULAR | Status: AC
  Filled 2017-12-06: qty 2

## 2017-12-06 NOTE — Op Note (Addendum)
[facility] Patient Name: Christina Richardson Procedure Date: 12/06/2017 9:32 AM MRN: [id number] Date of Birth: 01/19/1965 Attending MD: Norvel Richards , MD CSN: [id number] Age: 52 Admit Type: Outpatient Procedure:                Colonoscopy Indications:              Abnormal CT of the GI tract Providers:                Norvel Richards, MD, Gerome Sam, RN,                            Aram Candela Referring MD:              Medicines:                Propofol per Anesthesia, Midazolam 6 mg IV,                            Meperidine 50 mg IV Complications:            No immediate complications. Estimated Blood Loss:     Estimated blood loss was minimal. Procedure:                Pre-Anesthesia Assessment:                           - Prior to the procedure, a History and Physical                            was performed, and patient medications and                            allergies were reviewed. The patient's tolerance of                            previous anesthesia was also reviewed. The risks                            and benefits of the procedure and the sedation                            options and risks were discussed with the patient.                            All questions were answered, and informed consent                            was obtained. Prior Anticoagulants: The patient has                            taken no previous anticoagulant or antiplatelet                            agents. ASA Grade Assessment: II - A patient with  mild systemic disease. After reviewing the risks                            and benefits, the patient was deemed in                            satisfactory condition to undergo the procedure.                           After obtaining informed consent, the colonoscope                            was passed under direct vision. Throughout the                            procedure, the patient's blood  pressure, pulse, and                            oxygen saturations were monitored continuously. The                            CF-[id number] ([id number]) scope was introduced through                            the anus and advanced to the the cecum, identified                            by appendiceal orifice and ileocecal valve. The                            colonoscopy was performed without difficulty. The                            patient tolerated the procedure well. The quality                            of the bowel preparation was adequate. The                            ileocecal valve, appendiceal orifice, and rectum                            were photographed. The entire colon was well                            visualized. Scope In: 10:09:15 AM Scope Out: 10:27:00 AM Scope Withdrawal Time: 0 hours 14 minutes 1 second  Total Procedure Duration: 0 hours 17 minutes 45 seconds  Findings:      The perianal and digital rectal examinations were normal.      A diffuse area of moderately nodular mucosa was found in the entire       colon. Patient appeared to have pseudopolyps covering his proximal       rectum all the way to the cecum. There was patchy erythema granularity  and loss of the vascular pattern throughout the proximal rectum to the       cecum. The ileocecal valve appeared normal however, I was unable to       intubate. 2.5 cm submucosal nodule at the hepatic flexure. Positive       pillow sign?"consistent with lipoma.      A 5 mm polyp was found in the cecum (one of dozens and dozens). The       polyp was semi-pedunculated. The polyp was removed with a cold snare.       Resection and retrieval were complete. Estimated blood loss was minimal.      The exam was otherwise without abnormality on direct and retroflexion       views. Segmental biopsies from the rectum to the cecum taken for       histologic study. Impression:               - Nodular mucosa in the entire  examined colon.                            Biopsied.                           - One 5 mm polyp in the cecum, removed with a cold                            snare. Abnormal colonic and rectal mucosa                            consistent with chronic ulcerative colitis with                            pseudopolyp formation. Colonic lipoma.                           - The examination was otherwise normal on direct                            and retroflexion views. Moderate Sedation:      Moderate (conscious) sedation was administered by the endoscopy nurse       and supervised by the endoscopist. The following parameters were       monitored: oxygen saturation, heart rate, blood pressure, respiratory       rate, EKG, adequacy of pulmonary ventilation, and response to care.       Total physician intraservice time was 27 minutes. Recommendation:           - Patient has a contact number available for                            emergencies. The signs and symptoms of potential                            delayed complications were discussed with the                            patient. Return to normal activities tomorrow.  Written discharge instructions were provided to the                            patient.                           - Resume previous diet.                           - Continue present medications. Begin Lialda today                            as planned. Resume prednisone 20 mg daily. Office                            follow-up with [place] in 4 to 6 weeks                           - Await pathology results.                           - Repeat colonoscopy date to be determined after                            pending pathology results are reviewed for                            surveillance based on pathology results.                           - Return to GI office in 6 weeks. Procedure Code(s):        --- Professional ---                           [phone],  Colonoscopy, flexible; with removal of                            tumor(s), polyp(s), or other lesion(s) by snare                            technique                           45380, 59, Colonoscopy, flexible; with biopsy,                            single or multiple                           99153, Moderate sedation; each additional 15                            minutes intraservice time                           G0500, Moderate sedation services provided by the  same physician or other qualified health care                            professional performing a gastrointestinal                            endoscopic service that sedation supports,                            requiring the presence of an independent trained                            observer to assist in the monitoring of the                            patient's level of consciousness and physiological                            status; initial 15 minutes of intra-service time;                            patient age 10 years or older (additional time may                            be reported with [phone], as appropriate) Diagnosis Code(s):        --- Professional ---                           K63.89, Other specified diseases of intestine                           D12.0, Benign neoplasm of cecum                           R93.3, Abnormal findings on diagnostic imaging of                            other parts of digestive tract CPT copyright 2018 American Medical Association. All rights reserved. The codes documented in this report are preliminary and upon coder review may  be revised to meet current compliance requirements. Cristopher Estimable. Rourk, MD Norvel Richards, MD 12/06/2017 10:38:35 AM This report has been signed electronically. Number of Addenda: 0

## 2017-12-06 NOTE — H&P (Signed)
@LOGO @   Primary Care Physician:  Loman Brooklyn, FNP Primary Gastroenterologist:  Dr. Gala Romney  Pre-Procedure History & Physical: HPI:  Christina Richardson is a 52 y.o. female here for further evaluate bloody diarrhea.  Recent history of pancolitis.  Symptoms got better on prednisone but is now off and her symptoms have recurred.  She is no longer taking nonsteroidal agents.  Past Medical History:  Diagnosis Date  . Gastroenteritis   . GERD (gastroesophageal reflux disease)   . Mixed hyperlipidemia   . Seasonal allergies     Past Surgical History:  Procedure Laterality Date  . ABDOMINAL HYSTERECTOMY     With oopherectomy    Prior to Admission medications   Medication Sig Start Date End Date Taking? Authorizing Provider  acetaminophen (TYLENOL) 325 MG tablet Take 650 mg by mouth every 6 (six) hours as needed (for pain.).   Yes [provider]  atorvastatin (LIPITOR) 10 MG tablet Take 10 mg by mouth daily.  08/12/17  Yes [provider]  Biotin w/ Vitamins C & E (HAIR SKIN & NAILS GUMMIES PO) Take 2 tablets by mouth at bedtime.   Yes [provider]  clobetasol cream (TEMOVATE) 5.28 % Apply 1 application topically 2 (two) times daily as needed (for rash/irritated skin.).    Yes [provider]  hydrocortisone (ANUSOL-HC) 2.5 % rectal cream Place 1 application rectally daily as needed for hemorrhoids or anal itching.    Yes [provider]  Javier Docker Oil 500 MG CAPS Take 500 mg by mouth daily.   Yes [provider]  Lactobacillus-Inulin ([place] PO) Take 1 tablet by mouth daily.   Yes [provider]  Multiple Vitamins-Minerals (ADULT GUMMY) CHEW Chew 2 tablets by mouth daily.   Yes [provider]  omeprazole (PRILOSEC) 40 MG capsule Take 1 capsule (40 mg total) by mouth daily. 06/07/13  Yes Viyuoh, Adeline C, MD  mesalamine (APRISO) 0.375 g 24 hr capsule Take 4 capsules (1.5 g total) by mouth  daily. Patient not taking: Reported on 11/03/2017 10/30/17   Mahala Menghini, PA-C  predniSONE (DELTASONE) 10 MG tablet Take 3 tabs (30mg ) daily for 5 days. 2 tabs (20mg ) daily for 5 days, 1 tab (10mg ) daily for 5 days, 1/2 tab (5mg ) daily for 5 days. Patient not taking: Reported on 11/03/2017 10/12/17   Mahala Menghini, PA-C    Allergies as of 10/23/2017 - Review Complete 10/12/2017  Allergen Reaction Noted  . Nicotine Rash and Other (See Comments) 04/13/2017    Family History  Problem Relation Age of Onset  . Heart attack Mother   . Stroke Mother   . Diabetes Mother   . Stroke Maternal Grandmother   . Diabetes Maternal Grandmother   . Congestive Heart Failure Maternal Grandmother   . Lung disease Sister   . Stroke Maternal Uncle     Social History   Socioeconomic History  . Marital status: Married    Spouse name: Not on file  . Number of children: Not on file  . Years of education: Not on file  . Highest education level: Not on file  Occupational History  . Occupation: Marine scientist: [employer]    Comment: [employer] in [place]  . Financial resource strain: Not on file  . Food insecurity:    Worry: Not on file    Inability: Not on file  . Transportation needs:    Medical: Not on file    Non-medical:  Not on file  Tobacco Use  . Smoking status: Former Smoker    Years: 30.00    Types: Cigarettes    Last attempt to quit: 04/09/2014    Years since quitting: 3.6  . Smokeless tobacco: Never Used  Substance and Sexual Activity  . Alcohol use: No  . Drug use: No  . Sexual activity: Yes  Lifestyle  . Physical activity:    Days per week: Not on file    Minutes per session: Not on file  . Stress: Not on file  Relationships  . Social connections:    Talks on phone: Not on file    Gets together: Not on file    Attends religious service: Not on file    Active member of club or organization: Not on file    Attends meetings of clubs or  organizations: Not on file    Relationship status: Not on file  . Intimate partner violence:    Fear of current or ex partner: Not on file    Emotionally abused: Not on file    Physically abused: Not on file    Forced sexual activity: Not on file  Other Topics Concern  . Not on file  Social History Narrative  . Not on file    Review of Systems: See HPI, otherwise negative ROS  Physical Exam: BP 123/72   Pulse 77   Temp (!) 97.2 F (36.2 C) (Oral)   Resp 13   Ht 5' 8.5" (1.74 m)   Wt 70.8 kg   SpO2 100%   BMI 23.37 kg/m  General:   Alert,  Well-developed, well-nourished, pleasant and cooperative in NAD Neck:  Supple; no masses or thyromegaly. No significant cervical adenopathy. Lungs:  Clear throughout to auscultation.   No wheezes, crackles, or rhonchi. No acute distress. Heart:  Regular rate and rhythm; no murmurs, clicks, rubs,  or gallops. Abdomen: Non-distended, normal bowel sounds.  Soft and nontender without appreciable mass or hepatosplenomegaly.  Pulses:  Normal pulses noted. Extremities:  Without clubbing or edema.  Impression/Plan: Recent diagnosis of pancolitis.  Colonoscopy now to further evaluate.  The risks, benefits, limitations, alternatives and imponderables have been reviewed with the patient. Questions have been answered. All parties are agreeable.      Notice: This dictation was prepared with Dragon dictation along with smaller phrase technology. Any transcriptional errors that result from this process are unintentional and may not be corrected upon review.

## 2017-12-06 NOTE — Discharge Instructions (Signed)
°  Colonoscopy Discharge Instructions  Read the instructions outlined below and refer to this sheet in the next few weeks. These discharge instructions provide you with general information on caring for yourself after you leave the hospital. Your doctor may also give you specific instructions. While your treatment has been planned according to the most current medical practices available, unavoidable complications occasionally occur. If you have any problems or questions after discharge, call Dr. Gala Romney at [phone]. ACTIVITY  You may resume your regular activity, but move at a slower pace for the next 24 hours.   Take frequent rest periods for the next 24 hours.   Walking will help get rid of the air and reduce the bloated feeling in your belly (abdomen).   No driving for 24 hours (because of the medicine (anesthesia) used during the test).    Do not sign any important legal documents or operate any machinery for 24 hours (because of the anesthesia used during the test).  NUTRITION  Drink plenty of fluids.   You may resume your normal diet as instructed by your doctor.   Begin with a light meal and progress to your normal diet. Heavy or fried foods are harder to digest and may make you feel sick to your stomach (nauseated).   Avoid alcoholic beverages for 24 hours or as instructed.  MEDICATIONS  You may resume your normal medications unless your doctor tells you otherwise.  WHAT YOU CAN EXPECT TODAY  Some feelings of bloating in the abdomen.   Passage of more gas than usual.   Spotting of blood in your stool or on the toilet paper.  IF YOU HAD POLYPS REMOVED DURING THE COLONOSCOPY:  No aspirin products for 7 days or as instructed.   No alcohol for 7 days or as instructed.   Eat a soft diet for the next 24 hours.  FINDING OUT THE RESULTS OF YOUR TEST Not all test results are available during your visit. If your test results are not back during the visit, make an appointment  with your caregiver to find out the results. Do not assume everything is normal if you have not heard from your caregiver or the medical facility. It is important for you to follow up on all of your test results.  SEEK IMMEDIATE MEDICAL ATTENTION IF:  You have more than a spotting of blood in your stool.   Your belly is swollen (abdominal distention).   You are nauseated or vomiting.   You have a temperature over 101.   You have abdominal pain or discomfort that is severe or gets worse throughout the day.    Begin prednisone 20 mg daily today  Begin Lialda today or as soon as you get this medication (4 tablets daily)  Further recommendations to follow pending review of pathology report  Office visit with [place] in 4 to 6 weeks.  Feb 19th at 11:00am

## 2017-12-08 ENCOUNTER — Encounter: Payer: Self-pay | Admitting: Internal Medicine

## 2017-12-14 ENCOUNTER — Encounter (HOSPITAL_COMMUNITY): Payer: Self-pay | Admitting: Internal Medicine

## 2018-01-08 ENCOUNTER — Telehealth: Payer: Self-pay

## 2018-01-08 NOTE — Telephone Encounter (Signed)
Pt called office this afternoon, she received bill for TCS $5000. PA was obtained for original date of service 11/10/17. TCS was rescheduled to 12/06/17 d/t rash (see previous phone note). Date of service 12/06/17 was outside date range. Original PA was valid 10/23/17-11/10/17, WU#[id number]. Advised pt I would contact insurance company. Pt stated she could be reached at [phone] and can leave VM.   [facility], after several transfers I was connected to [place] [phone]). Spoke to [place] at [employer]. Informed Jen TCS was rescheduled d/t medical reason and fell outside dates for original PA. Delsa Sale advised she would re-process claim and to allow 30-45 business days for processing. Ref# for call: [id number]. Tried to call pt, no answer, LMOVM and informed pt claim would be re-processed and allow 30-45 business days. Advised pt if any further issue to contact our office.

## 2018-02-28 ENCOUNTER — Ambulatory Visit: Payer: Commercial Managed Care - PPO | Admitting: Nurse Practitioner

## 2018-03-07 ENCOUNTER — Telehealth: Payer: Self-pay | Admitting: Internal Medicine

## 2018-03-07 NOTE — Telephone Encounter (Signed)
See phone note for 01/08/18.

## 2018-03-07 NOTE — Telephone Encounter (Signed)
Pt called asking for MB. She said that she had talked to MB before about her insurance and her procedure. I told her MB wasn't available and transferred call to VM. [phone]

## 2018-03-07 NOTE — Telephone Encounter (Signed)
Pt called office and LMOVM requesting for me to call her insurance company because she is still getting a bill for $5000. She is at work but I can LMOVM.  [place] and spoke to [place]. She stated the claim was re-processed yesterday and the pt now owes $980. Confirmed the PA dates were changed to cover date of service 12/06/17. Tried to call pt, no answer, LMOVM to inform pt. Also gave her billing phone number.

## 2018-05-07 ENCOUNTER — Encounter

## 2018-05-07 ENCOUNTER — Telehealth: Payer: Self-pay

## 2018-05-07 ENCOUNTER — Other Ambulatory Visit: Payer: Self-pay

## 2018-05-07 ENCOUNTER — Ambulatory Visit (INDEPENDENT_AMBULATORY_CARE_PROVIDER_SITE_OTHER): Payer: Commercial Managed Care - PPO | Admitting: Nurse Practitioner

## 2018-05-07 ENCOUNTER — Encounter: Payer: Self-pay | Admitting: Nurse Practitioner

## 2018-05-07 DIAGNOSIS — K5100 Ulcerative (chronic) pancolitis without complications: Secondary | ICD-10-CM | POA: Diagnosis not present

## 2018-05-07 DIAGNOSIS — K51011 Ulcerative (chronic) pancolitis with rectal bleeding: Secondary | ICD-10-CM

## 2018-05-07 MED ORDER — PREDNISONE 10 MG PO TABS: ORAL_TABLET | ORAL | 0 refills | Status: AC

## 2018-05-07 NOTE — Telephone Encounter (Signed)
FYI EG pt called to see when she was suppose to lab work done? Pt had a phone visit this morning 05/07/2018. Pt would like her labs drawn at [facility].

## 2018-05-07 NOTE — Progress Notes (Signed)
Referring Provider: Loman Brooklyn, FNP Primary Care Physician:  Loman Brooklyn, FNP Primary GI:  Dr. Oneida Alar  NOTE: Service was provided via telemedicine and was requested by the patient due to COVID-19 pandemic.  Method of visit: Zoom  Patient Location: Home  Provider Location: Office  Reason for Phone Visit: post-procedure follow-up  The patient was consented to phone follow-up via telephone encounter including billing of the encounter (yes/no): Yes  Persons present on the phone encounter, with roles: Husband  Total time (minutes) spent on medical discussion: 26 minutes  Chief Complaint  Patient presents with  . Ulcerative Colitis    Flare up    HPI:   Christina Richardson is a 53 y.o. female who presents for virtual visit regarding: Postprocedure follow-up and colitis.  Patient was last seen in our office 10/12/2017 for pancolitis.  Noted diffuse pancolitis on CT imaging during hospitalization in August 2019 with negative C. difficile, O&P.  Suspected new onset IBD.  She was treated with steroids.  Tried and failed Colestid, dicyclomine, Levsin while inpatient.  On IV Solu-Medrol her symptoms significantly improved at discharge hemoglobin of 9 and albumin 2.5.  Her last visit she was having much less bright red blood per rectum but still with abdominal cramping unrelieved with dicyclomine or Levsin.  Stools are soft to watery despite bland diet.  Eating every 2-3 hours.  Symptoms typically between 9 PM and 6 AM.  Total of 5-10 stools a day.  Recommended prednisone taper, schedule colonoscopy.  Colonoscopy completed 12/06/2017 which found nodular mucosa throughout the entire colon status post biopsies, a single 5 mm polyp in the cecum.  Mucosa appeared consistent with chronic ulcerative colitis with pseudopolyp formation and colonic lipoma.  Surgical pathology found the polyp to be inflamed segment of polypoid mucosa and the biopsies to be mildly active colitis.  Recommended  continue current medications, begin Lialda, resume prednisone 20 mg daily, follow-up in 4 to 6 weeks.  Today she states she's doing ok overall. She started Lialda day of colonoscopy. Since coming home from the hospital and coming off Prednisone, symptoms recurred including rash, hair loss, "uncontrolled body temperature", abdominal pain, nausea, foul stools, frequent stools (up to 4-5 a day) which are loose/soft (like "soft serve"). Energy level is low, appetite is moderately poor, denies weight loss (has gained weight). Also with nausea, no vomiting. Denies fever, chills. Denies URI and flu-like symptoms. Denies chest pain, dyspnea, dizziness, lightheadedness, syncope, near syncope. Denies any other upper or lower GI symptoms.  Past Medical History:  Diagnosis Date  . Gastroenteritis   . GERD (gastroesophageal reflux disease)   . Mixed hyperlipidemia   . Seasonal allergies     Past Surgical History:  Procedure Laterality Date  . ABDOMINAL HYSTERECTOMY     With oopherectomy  . BIOPSY  12/06/2017   Procedure: BIOPSY;  Surgeon: Daneil Dolin, MD;  Location: [facility];  Service: Endoscopy;;   (colon)  . COLONOSCOPY N/A 12/06/2017   Procedure: COLONOSCOPY;  Surgeon: Daneil Dolin, MD;  Location: [facility];  Service: Endoscopy;  Laterality: N/A;  3:00pm  . POLYPECTOMY  12/06/2017   Procedure: POLYPECTOMY;  Surgeon: Daneil Dolin, MD;  Location: [facility];  Service: Endoscopy;;  (colon)    Current Outpatient Medications  Medication Sig Dispense Refill  . acetaminophen (TYLENOL) 325 MG tablet Take 650 mg by mouth every 6 (six) hours as needed (for pain.).    Marland Kitchen atorvastatin (LIPITOR) 10 MG tablet Take 10  mg by mouth daily.     . Biotin w/ Vitamins C & E (HAIR SKIN & NAILS GUMMIES PO) Take 2 tablets by mouth at bedtime.    . clobetasol cream (TEMOVATE) 9.38 % Apply 1 application topically 2 (two) times daily as needed (for rash/irritated skin.).     Marland Kitchen hydrocortisone  (ANUSOL-HC) 2.5 % rectal cream Place 1 application rectally daily as needed for hemorrhoids or anal itching.     Javier Docker Oil 500 MG CAPS Take 500 mg by mouth daily.    . mesalamine (LIALDA) 1.2 g EC tablet Take 1.2 g by mouth daily with breakfast. Takes 4 tablets at breakfast    . Multiple Vitamins-Minerals (ADULT GUMMY) CHEW Chew 2 tablets by mouth daily.    Marland Kitchen omeprazole (PRILOSEC) 40 MG capsule Take 1 capsule (40 mg total) by mouth daily. 30 capsule 0  . predniSONE (DELTASONE) 10 MG tablet Take 3 tablets (30 mg total) by mouth daily with breakfast for 7 days, THEN 2 tablets (20 mg total) daily with breakfast for 7 days, THEN 1 tablet (10 mg total) daily with breakfast for 7 days, THEN 0.5 tablets (5 mg total) daily with breakfast for 7 days. 46 tablet 0   No current facility-administered medications for this visit.     Allergies as of 05/07/2018 - Review Complete 05/07/2018  Allergen Reaction Noted  . Nicotine Rash and Other (See Comments) 04/13/2017    Family History  Problem Relation Age of Onset  . Heart attack Mother   . Stroke Mother   . Diabetes Mother   . Stroke Maternal Grandmother   . Diabetes Maternal Grandmother   . Congestive Heart Failure Maternal Grandmother   . Lung disease Sister   . Stroke Maternal Uncle   . Colon cancer Neg Hx   . Inflammatory bowel disease Neg Hx     Social History   Socioeconomic History  . Marital status: Married    Spouse name: Not on file  . Number of children: Not on file  . Years of education: Not on file  . Highest education level: Not on file  Occupational History  . Occupation: Marine scientist: [employer]    Comment: [employer] in [place]  . Financial resource strain: Not on file  . Food insecurity:    Worry: Not on file    Inability: Not on file  . Transportation needs:    Medical: Not on file    Non-medical: Not on file  Tobacco Use  . Smoking status: Former Smoker    Years: 30.00    Types:  Cigarettes    Last attempt to quit: 04/09/2014    Years since quitting: 4.0  . Smokeless tobacco: Never Used  Substance and Sexual Activity  . Alcohol use: No  . Drug use: No  . Sexual activity: Yes  Lifestyle  . Physical activity:    Days per week: Not on file    Minutes per session: Not on file  . Stress: Not on file  Relationships  . Social connections:    Talks on phone: Not on file    Gets together: Not on file    Attends religious service: Not on file    Active member of club or organization: Not on file    Attends meetings of clubs or organizations: Not on file    Relationship status: Not on file  Other Topics Concern  . Not on file  Social History Narrative  .  Not on file    Review of Systems: Complete ROS negative except as per HPI.  Physical Exam: Note: limited exam due to virtual visit General:   Alert and oriented. Pleasant and cooperative. Well-nourished and well-developed.  Head:  Normocephalic and atraumatic. Eyes:  Without icterus, sclera clear and conjunctiva pink.  Ears:  Normal auditory acuity. Skin:  Intact without facial significant lesions or rashes. Neurologic:  Alert and oriented x4;  grossly normal neurologically. Psych:  Alert and cooperative. Normal mood and affect. Heme/Lymph/Immune: No excessive bruising noted.

## 2018-05-08 NOTE — Telephone Encounter (Signed)
She can have the labs drawn at any time. I will re-enter them for [facility]. Have the labs drawn BEFORE starting the prednisone.

## 2018-05-08 NOTE — Telephone Encounter (Signed)
Pt is aware that she can get labs done anytime before starting prednisone.

## 2018-05-08 NOTE — Addendum Note (Signed)
Addended by: Gordy Levan, Machell Wirthlin A on: 05/08/2018 02:41 PM   Modules accepted: Orders

## 2018-05-10 LAB — COMPREHENSIVE METABOLIC PANEL
ALT: 22 IU/L (ref 0–32)
AST: 18 IU/L (ref 0–40)
Albumin/Globulin Ratio: 2.4 — ABNORMAL HIGH (ref 1.2–2.2)
Albumin: 4.7 g/dL (ref 3.8–4.8)
Alkaline Phosphatase: 87 IU/L (ref 39–117)
BUN/Creatinine Ratio: 15 (ref 9–23)
BUN: 11 mg/dL (ref 6–24)
Bilirubin Total: 0.5 mg/dL (ref 0.0–1.2)
CO2: 23 mmol/L (ref 20–29)
Calcium: 9.7 mg/dL (ref 8.7–10.2)
Chloride: 102 mmol/L (ref 96–106)
Creatinine, Ser: 0.72 mg/dL (ref 0.57–1.00)
GFR calc Af Amer: 113 mL/min/{1.73_m2} (ref >59)
GFR calc non Af Amer: 98 mL/min/{1.73_m2} (ref >59)
Globulin, Total: 2 g/dL (ref 1.5–4.5)
Glucose: 98 mg/dL (ref 65–99)
Potassium: 4.3 mmol/L (ref 3.5–5.2)
Sodium: 140 mmol/L (ref 134–144)
Total Protein: 6.7 g/dL (ref 6.0–8.5)

## 2018-05-10 LAB — CBC WITH DIFFERENTIAL/PLATELET
Basophils Absolute: 0 10*3/uL (ref 0.0–0.2)
Basos: 1 %
EOS (ABSOLUTE): 0.2 10*3/uL (ref 0.0–0.4)
Eos: 3 %
Hematocrit: 33.2 % — ABNORMAL LOW (ref 34.0–46.6)
Hemoglobin: 11 g/dL — ABNORMAL LOW (ref 11.1–15.9)
Immature Grans (Abs): 0 10*3/uL (ref 0.0–0.1)
Immature Granulocytes: 0 %
Lymphocytes Absolute: 2.5 10*3/uL (ref 0.7–3.1)
Lymphs: 44 %
MCH: 25.9 pg — ABNORMAL LOW (ref 26.6–33.0)
MCHC: 33.1 g/dL (ref 31.5–35.7)
MCV: 78 fL — ABNORMAL LOW (ref 79–97)
Monocytes Absolute: 0.4 10*3/uL (ref 0.1–0.9)
Monocytes: 8 %
Neutrophils Absolute: 2.6 10*3/uL (ref 1.4–7.0)
Neutrophils: 44 %
Platelets: 284 10*3/uL (ref 150–450)
RBC: 4.24 x10E6/uL (ref 3.77–5.28)
RDW: 15.9 % — ABNORMAL HIGH (ref 11.7–15.4)
WBC: 5.8 10*3/uL (ref 3.4–10.8)

## 2018-05-10 LAB — C-REACTIVE PROTEIN: CRP: 2 mg/L (ref 0–10)

## 2018-05-10 LAB — SEDIMENTATION RATE: Sed Rate: 2 mm/hr (ref 0–40)

## 2018-05-10 NOTE — Patient Instructions (Addendum)
Your health issues we discussed today were:   Ulcerative colitis: 1. It sounds like he may be having a flare of your disease 2. Have your labs drawn as soon as you can 3. AFTER completing your labs, start a prednisone taper that I have sent to your pharmacy: 1. Prednisone 30 mg a day for 7 days, then 2. Prednisone 20 mg a day for 7 days, then 3. Prednisone 10 mg a day for 7 days, then 4. Prednisone 5 mg for 7 days 5. STOP 4. We will call you with your lab results 5. Call us if you have any significant worsening of your symptoms  Overall I recommend:  1. Continue your other current medications 2. Call us if you have any questions or concerns 3. Return for follow-up in 6 weeks   Because of recent events of COVID-19 ("Coronavirus"), follow CDC recommendations:  1. Wash your hand frequently 2. Avoid touching your face 3. Stay away from people who are sick 4. If you have symptoms such as fever, cough, shortness of breath then call your healthcare provider for further guidance 5. If you are sick, STAY AT HOME unless otherwise directed by your healthcare provider. 6. Follow directions from state and national officials regarding staying safe    At [facility] we value your feedback. You may receive a survey about your visit today. Please share your experience as we strive to create trusting relationships with our patients to provide genuine, compassionate, quality care.  We appreciate your understanding and patience as we review any laboratory studies, imaging, and other diagnostic tests that are ordered as we care for you. Our office policy is 5 business days for review of these results, and any emergent or urgent results are addressed in a timely manner for your best interest. If you do not hear from our office in 1 week, please contact us.   We also encourage the use of MyChart, which contains your medical information for your review as well. If you are not enrolled in  this feature, an access code is on this after visit summary for your convenience. Thank you for allowing [place] to be involved in your care.  It was great to see you today!  I hope you have a great day!!

## 2018-05-10 NOTE — Assessment & Plan Note (Signed)
The patient was diagnosed with pancolitis during hospital admission and colonoscopy.  She was started on Lialda.  However, since coming off prednisone she has had worsening abdominal pain, frequent stools, poor energy.  Query flareup of ulcerative colitis.  At this point I will check labs including CBC, CMP.  I will also check CRP and sed rate for baseline "flare-like symptoms" levels to try to gauge utility in the future for evaluation of the flares.  We will start her on a prednisone taper 30 mg a day for 7 days, 1 mg a day for 7 to 10 mg a day for 7days follow-up in 6 weeks.  Call for any worsening symptoms or concerns.

## 2018-05-14 NOTE — Progress Notes (Signed)
CC'D TO PCP °

## 2020-06-04 ENCOUNTER — Telehealth: Payer: Self-pay

## 2020-06-04 NOTE — Telephone Encounter (Signed)
NOTES ON FILE FROM [facility] [id number], SENT REFERRAL TO SCHEDULING

## 2022-11-25 ENCOUNTER — Encounter: Payer: Self-pay | Admitting: *Deleted
